# Patient Record
Sex: Female | Born: 1965 | Race: White | Hispanic: No | Marital: Married | Smoking: Never smoker
Health system: Southern US, Community
[De-identification: ages and names within clinical notes are randomized; demographics above are authoritative.]

## PROBLEM LIST (undated history)

## (undated) DIAGNOSIS — R350 Frequency of micturition: Secondary | ICD-10-CM

## (undated) DIAGNOSIS — G5603 Carpal tunnel syndrome, bilateral upper limbs: Secondary | ICD-10-CM

## (undated) DIAGNOSIS — K760 Fatty (change of) liver, not elsewhere classified: Secondary | ICD-10-CM

## (undated) DIAGNOSIS — E7800 Pure hypercholesterolemia, unspecified: Secondary | ICD-10-CM

## (undated) DIAGNOSIS — M0609 Rheumatoid arthritis without rheumatoid factor, multiple sites: Secondary | ICD-10-CM

## (undated) DIAGNOSIS — F419 Anxiety disorder, unspecified: Secondary | ICD-10-CM

## (undated) DIAGNOSIS — R7303 Prediabetes: Secondary | ICD-10-CM

## (undated) DIAGNOSIS — Z79899 Other long term (current) drug therapy: Secondary | ICD-10-CM

## (undated) DIAGNOSIS — F32A Depression, unspecified: Secondary | ICD-10-CM

## (undated) DIAGNOSIS — R202 Paresthesia of skin: Secondary | ICD-10-CM

## (undated) DIAGNOSIS — R200 Anesthesia of skin: Secondary | ICD-10-CM

## (undated) DIAGNOSIS — M2550 Pain in unspecified joint: Secondary | ICD-10-CM

## (undated) HISTORY — DX: Anesthesia of skin: R20.0

## (undated) HISTORY — DX: Pain in unspecified joint: M25.50

## (undated) HISTORY — PX: BREAST BIOPSY: SHX20

## (undated) HISTORY — PX: WISDOM TOOTH EXTRACTION: SHX21

## (undated) HISTORY — PX: OTHER SURGICAL HISTORY: SHX169

## (undated) HISTORY — DX: Other long term (current) drug therapy: Z79.899

## (undated) HISTORY — DX: Fatty (change of) liver, not elsewhere classified: K76.0

## (undated) HISTORY — DX: Paresthesia of skin: R20.2

## (undated) HISTORY — DX: Rheumatoid arthritis without rheumatoid factor, multiple sites: M06.09

## (undated) HISTORY — DX: Frequency of micturition: R35.0

## (undated) HISTORY — DX: Carpal tunnel syndrome, bilateral upper limbs: G56.03

---

## 2015-08-11 ENCOUNTER — Other Ambulatory Visit: Payer: Self-pay | Admitting: Family Medicine

## 2015-08-11 DIAGNOSIS — Z1239 Encounter for other screening for malignant neoplasm of breast: Secondary | ICD-10-CM

## 2015-08-11 DIAGNOSIS — Z1231 Encounter for screening mammogram for malignant neoplasm of breast: Secondary | ICD-10-CM

## 2015-08-25 ENCOUNTER — Ambulatory Visit
Admission: RE | Admit: 2015-08-25 | Discharge: 2015-08-25 | Disposition: A | Discharge status: Home / Self Care | Payer: BLUE CROSS/BLUE SHIELD | Source: Ambulatory Visit | Attending: Family Medicine | Admitting: Family Medicine

## 2015-08-25 DIAGNOSIS — Z1231 Encounter for screening mammogram for malignant neoplasm of breast: Secondary | ICD-10-CM

## 2015-09-06 ENCOUNTER — Other Ambulatory Visit: Payer: Self-pay | Admitting: Family Medicine

## 2015-09-06 DIAGNOSIS — R928 Other abnormal and inconclusive findings on diagnostic imaging of breast: Secondary | ICD-10-CM

## 2015-09-13 ENCOUNTER — Other Ambulatory Visit: Payer: Self-pay | Admitting: Family Medicine

## 2015-09-13 ENCOUNTER — Ambulatory Visit
Admission: RE | Admit: 2015-09-13 | Discharge: 2015-09-13 | Disposition: A | Discharge status: Home / Self Care | Payer: BLUE CROSS/BLUE SHIELD | Source: Ambulatory Visit | Attending: Family Medicine | Admitting: Family Medicine

## 2015-09-13 DIAGNOSIS — R928 Other abnormal and inconclusive findings on diagnostic imaging of breast: Secondary | ICD-10-CM

## 2015-09-15 ENCOUNTER — Other Ambulatory Visit: Payer: Self-pay | Admitting: Family Medicine

## 2015-09-15 DIAGNOSIS — R928 Other abnormal and inconclusive findings on diagnostic imaging of breast: Secondary | ICD-10-CM

## 2015-09-16 ENCOUNTER — Other Ambulatory Visit: Payer: Self-pay | Admitting: Family Medicine

## 2015-09-16 ENCOUNTER — Ambulatory Visit
Admission: RE | Admit: 2015-09-16 | Discharge: 2015-09-16 | Disposition: A | Discharge status: Home / Self Care | Payer: BLUE CROSS/BLUE SHIELD | Source: Ambulatory Visit | Attending: Family Medicine | Admitting: Family Medicine

## 2015-09-16 DIAGNOSIS — R928 Other abnormal and inconclusive findings on diagnostic imaging of breast: Secondary | ICD-10-CM

## 2015-11-17 ENCOUNTER — Encounter: Payer: Self-pay | Admitting: *Deleted

## 2015-11-18 ENCOUNTER — Ambulatory Visit (INDEPENDENT_AMBULATORY_CARE_PROVIDER_SITE_OTHER): Payer: BLUE CROSS/BLUE SHIELD | Admitting: Neurology

## 2015-11-18 ENCOUNTER — Encounter: Payer: Self-pay | Admitting: Neurology

## 2015-11-18 ENCOUNTER — Telehealth: Payer: Self-pay | Admitting: Neurology

## 2015-11-18 VITALS — BP 130/80 | HR 78 | Ht 65.0 in | Wt 173.6 lb

## 2015-11-18 DIAGNOSIS — R202 Paresthesia of skin: Secondary | ICD-10-CM

## 2015-11-18 DIAGNOSIS — G5603 Carpal tunnel syndrome, bilateral upper limbs: Secondary | ICD-10-CM | POA: Diagnosis not present

## 2015-11-18 DIAGNOSIS — M2550 Pain in unspecified joint: Secondary | ICD-10-CM

## 2015-11-18 NOTE — Telephone Encounter (Signed)
Noted  

## 2015-11-18 NOTE — Progress Notes (Signed)
[facility] Clinic Note - Initial Visit   Date: 11/18/2015  Nicole Petersen MRN: [id number] DOB: 19-Apr-1966   Dear Dr. Leafy Kindle, PA:  Thank you for your kind referral of Nicole Petersen for consultation of paresthesias. Although her history is well known to you, please allow [place] to reiterate it for the purpose of our medical record. The patient was accompanied to the clinic by self.    History of Present Illness: Nicole Petersen is a 50 y.o. right-handed Caucasian female with seronegative rheumatoid arthritis (on Humira) and history of fatty liver presents for evaluation of generalized paresthesias.    Starting the fall of 2016, she began waking up with numbness involving both hands, worse on the left.  Symptoms last about 10-minutes and then spontaneously improve.  She reports dropping objects several times per day.  Over the past few weeks, she has a linear area over the calf and posterior thigh described as numbness which is very sporadic, lasting a few seconds but recurring multiple times per day. She denies any weakness of the legs.  She was having tingling sensation over the entire lower back and was diagnosed with UTI. She was told there were crystals in her urine and reports that since urinating the crystals, her tingling resolved.  She did not wish to take antibiotics.    She also complains of generalized joint pain, especially achy pain in the hands and elbows. Pain was worse over the lateral elbow, but now involves the medial elbow.  If she sits or stands very long, her feet get very stiff. She complains of swollen hands. She was diagnosed with probable rheumatoid arthritis and is currently on Humira.    She was evaluated by rheumatology for widespread pain and generalized paresthesias and informed that symptoms are not consistent with inflammatory arthritis, but moreso fibromyalgia and referred for my opinion.   She denies any muscle pain, tenderness,  or generalized fatigue.    Out-side paper records, electronic medical record, and images have been reviewed where available and summarized as:  Labs 10/13/2015:  ESR 2, CRP 2.6, Na 144, K 5.1, Cr 0.62, ALT 47*, AST 24, BUN 19, vitamin B12 556, TSH 0.59  MRI right wrist with contrast 11/27/2014:  Findings of early inflammatory arthropathy predominately involving the wrist and MCP joints.  There is active tenosynovitis, synovitis, and early findings of subchondral edema of the 5th metacarpal head.  This may represent early manifestations of rheumatoid arthritis.   Past Medical History  Diagnosis Date  . Carpal tunnel syndrome on both sides   . Paresthesia of skin   . Encounter for long-term (current) use of high-risk medication   . Anesthesia of skin   . Frequency of micturition   . Fatty liver   . Rheumatoid arthritis of multiple sites without rheumatoid factor ([place])   . Polyarthralgia     Past Surgical History  Procedure Laterality Date  . None       Medications:  Outpatient Encounter Prescriptions as of 11/18/2015  Medication Sig  . Adalimumab (HUMIRA PEN) 40 MG/0.8ML PNKT Inject into the skin.  . Omega 3 1000 MG CAPS Take by mouth.  Marland Kitchen omeprazole (PRILOSEC) 20 MG capsule Take 20 mg by mouth daily.   No facility-administered encounter medications on file as of 11/18/2015.     Allergies: No Known Allergies  Family History: Family History  Problem Relation Age of Onset  . Heart disease Father     Deceased  . Diabetes Mother   .  Heart disease Mother     Deceased  . Hypertension Mother   . Lymphoma Brother     Deceased  . Healthy Sister   . Healthy Brother   . Healthy Son     Social History: Social History  Substance Use Topics  . Smoking status: Never Smoker   . Smokeless tobacco: Never Used  . Alcohol Use: No   Social History   Social History Narrative   Lives with husband in a one story home but does have to go up a flight of stairs to get into house.  Has one  son in the [place].     Works as a Designer, industrial/product.  Education: high school.    Review of Systems:  CONSTITUTIONAL: No fevers, chills, night sweats, or weight loss.   EYES: No visual changes or eye pain ENT: No hearing changes.  No history of nose bleeds.   RESPIRATORY: No cough, wheezing and shortness of breath.   CARDIOVASCULAR: Negative for chest pain, and palpitations.   GI: Negative for abdominal discomfort, blood in stools or black stools.  No recent change in bowel habits.   GU:  No history of incontinence.   MUSCLOSKELETAL: +history of joint pain or swelling.  No myalgias.   SKIN: Negative for lesions, rash, and itching.   HEMATOLOGY/ONCOLOGY: Negative for prolonged bleeding, bruising easily, and swollen nodes.  No history of cancer.   ENDOCRINE: Negative for cold or heat intolerance, polydipsia or goiter.   PSYCH:  No depression or anxiety symptoms.   NEURO: As Above.   Vital Signs:  BP 130/80 mmHg  Pulse 78  Ht 5' 5"  (1.651 m)  Wt 173 lb 9 oz (78.727 kg)  BMI 28.88 kg/m2  SpO2 98%   General Medical Exam:   General:  Well appearing, comfortable.   Eyes/ENT: see cranial nerve examination.   Neck: No masses appreciated.  Full range of motion without tenderness.  No carotid bruits. Respiratory:  Clear to auscultation, good air entry bilaterally.   Cardiac:  Regular rate and rhythm, no murmur.   Extremities:  No deformities, edema, or skin discoloration.  Skin:  No rashes or lesions.  Neurological Exam: MENTAL STATUS including orientation to time, place, person, recent and remote memory, attention span and concentration, language, and fund of knowledge is normal.  Speech is not dysarthric.  CRANIAL NERVES: II:  No visual field defects.  Unremarkable fundi.   III-IV-VI: Pupils equal round and reactive to light.  Normal conjugate, extra-ocular eye movements in all directions of gaze.  No nystagmus.  No ptosis.   V:  Normal facial sensation.     VII:  Normal facial symmetry  and movements.  No pathologic facial reflexes.  VIII:  Normal hearing and vestibular function.   IX-X:  Normal palatal movement.   XI:  Normal shoulder shrug and head rotation.   XII:  Normal tongue strength and range of motion, no deviation or fasciculation.  MOTOR:  No atrophy, fasciculations or abnormal movements.  No pronator drift.  Tone is normal.    Right Upper Extremity:    Left Upper Extremity:    Deltoid  5/5   Deltoid  5/5   Biceps  5/5   Biceps  5/5   Triceps  5/5   Triceps  5/5   Wrist extensors  5/5   Wrist extensors  5/5   Wrist flexors  5/5   Wrist flexors  5/5   Finger extensors  5/5   Finger extensors  5/5  Finger flexors  5/5   Finger flexors  5/5   Dorsal interossei  5/5   Dorsal interossei  5/5   Abductor pollicis  5/5   Abductor pollicis  5/5   Tone (Ashworth scale)  0  Tone (Ashworth scale)  0   Right Lower Extremity:    Left Lower Extremity:    Hip flexors  5/5   Hip flexors  5/5   Hip extensors  5/5   Hip extensors  5/5   Knee flexors  5/5   Knee flexors  5/5   Knee extensors  5/5   Knee extensors  5/5   Dorsiflexors  5/5   Dorsiflexors  5/5   Plantarflexors  5/5   Plantarflexors  5/5   Toe extensors  5/5   Toe extensors  5/5   Toe flexors  5/5   Toe flexors  5/5   Tone (Ashworth scale)  0  Tone (Ashworth scale)  0   MSRs:  Right                                                                 Left brachioradialis 2+  brachioradialis 2+  biceps 2+  biceps 2+  triceps 2+  triceps 2+  patellar 2+  patellar 2+  ankle jerk 2+  ankle jerk 2+  Hoffman no  Hoffman no  plantar response down  plantar response down   SENSORY:  Normal and symmetric perception of light touch, pinprick, vibration, and proprioception. There is no sensory level.   Romberg's sign absent.   COORDINATION/GAIT: Normal finger-to- nose-finger and heel-to-shin.  Intact rapid alternating movements bilaterally.  Able to rise from a chair without using arms.  Gait narrow based and stable.  Tandem and stressed gait intact.   Other:  She is not tender over any of the FP tenderpoints, except point tenderness over the medial elbow.  IMPRESSION: Ms. Kracke is a 50 year-old female referred for evaluation of generalized paresthesias.  Her hand paresthesias are most consistent with carpal tunnel syndrome.  It was explained that even if she did have CTS, her hand swelling and joint pain cannot be explained by CTS and she may have an underlying arthritis, but I will defer to rheumatology about the nature of jpolyarthriagias and swelling.  She does not have symptoms consistent with fibromylagia and lacks the typical tenderpoints for this diagnosis.  Her leg paresthesias are sporadic and do not conform to a dermatomal or cutaneous nerve distribution, so these are most likely nonspecific paresthesias.  To be sure a lumbosacral radiculopathy is not missed, NCS/EMG will be performed.  PLAN/RECOMMENDATIONS:  NCS/EMG of the left arm and leg with additional median studies on the right Results with be communicated via telephone  Return to clinic as needed  The duration of this appointment visit was 45 minutes of face-to-face time with the patient.  Greater than 50% of this time was spent in counseling, explanation of diagnosis, planning of further management, and coordination of care.   Thank you for allowing me to participate in patient's care.  If I can answer any additional questions, I would be pleased to do so.    Sincerely,    Maykel Reitter K. Posey Pronto, DO

## 2015-11-18 NOTE — Telephone Encounter (Signed)
Nicole BlightDonna Petersen Nov 06, 2065 called to let you know she left her disk here. She had an appointment with Dr. Allena KatzPatel this morning. She would like to pick it back up at her next appointment. Thank you

## 2015-11-18 NOTE — Patient Instructions (Signed)
NCS/EMG of the left and leg We will call you with the results of the NCS/EMG

## 2015-11-23 ENCOUNTER — Ambulatory Visit (INDEPENDENT_AMBULATORY_CARE_PROVIDER_SITE_OTHER): Payer: BLUE CROSS/BLUE SHIELD | Admitting: Neurology

## 2015-11-23 DIAGNOSIS — R202 Paresthesia of skin: Secondary | ICD-10-CM

## 2015-11-23 DIAGNOSIS — M2550 Pain in unspecified joint: Secondary | ICD-10-CM

## 2015-11-23 NOTE — Procedures (Signed)
[facility]  [address], [address]  [place], [place] [address] Tel: [phone] Fax:  [phone] Test Date:  11/23/2015  Patient: Nicole Petersen DOB: 14-Feb-1966 Physician: Nita Sickleonika Patel, DO  Sex: Female Height: 5\' 5"  Ref Phys: Nita Sickleonika Patel, DO  ID#: [id number] Temp: 34.2C Technician: Judie PetitM. Dean   Patient Complaints: This is a 50 year old female referred for evaluation of bilateral hand and left lower extremity paresthesias.  NCV & EMG Findings: Extensive electrodiagnostic testing of the left upper and lower extremities and additional studies of the right upper extremity shows: 1. All sensory responses are within normal limits including bilateral median, left ulnar, left sural, and left superficial peroneal sensory responses are within normal limits. Bilateral mixed palmar studies are within normal limits. 2. All motor responses including bilateral median, left ulnar, left peroneal, and left tibial motor nerves are within normal limits. 3. There is no evidence of active or chronic motor axon loss changes affecting any of the tested muscles. Motor unit configuration and recruitment pattern is within normal limits.  Impression: This is a normal study. In particular, there is no evidence of carpal tunnel syndrome bilaterally, diffuse myopathy, sensorimotor polyneuropathy, or a cervical/lumbosacral radiculopathy.   ___________________________ Nita Sickleonika Patel, DO    Nerve Conduction Studies Anti Sensory Summary Table   Stim Site NR Peak (ms) Norm Peak (ms) P-T Amp (V) Norm P-T Amp  Left Median Anti Sensory (2nd Digit)  Wrist    3.1 <3.6 26.4 >15  Right Median Anti Sensory (2nd Digit)  Wrist    3.2 <3.6 22.5 >15  Left Sup Peroneal Anti Sensory (Ant Lat Mall)  12 cm    3.5 <4.6 9.1 >4  Left Sural Anti Sensory (Lat Mall)  Calf    3.3 <4.6 15.3 >4  Left Ulnar Anti Sensory (5th Digit)  Wrist    2.9 <3.1 25.9 >10   Motor Summary Table   Stim Site NR Onset (ms) Norm Onset  (ms) O-P Amp (mV) Norm O-P Amp Site1 Site2 Delta-0 (ms) Dist (cm) Vel (m/s) Norm Vel (m/s)  Left Median Motor (Abd Poll Brev)  Wrist    3.3 <4.0 9.7 >6 Elbow Wrist 4.0 23.0 58 >50  Elbow    7.3  9.4         Right Median Motor (Abd Poll Brev)  Wrist    3.7 <4.0 7.7 >6 Elbow Wrist 3.8 23.0 61 >50  Elbow    7.5  7.2         Left Peroneal Motor (Ext Dig Brev)  Ankle    3.5 <6.0 2.8 >2.5 B Fib Ankle 6.7 33.0 49 >40  B Fib    10.2  2.6  Poplt B Fib 2.1 10.0 48 >40  Poplt    12.3  2.6         Left Tibial Motor (Abd Hall Brev)  Ankle    4.1 <6.0 4.1 >4 Knee Ankle 7.5 38.0 51 >40  Knee    11.6  3.5         Left Ulnar Motor (Abd Dig Minimi)  Wrist    2.7 <3.1 8.7 >7 B Elbow Wrist 2.8 17.0 61 >50  B Elbow    5.5  8.6  A Elbow B Elbow 1.6 10.0 63 >50  A Elbow    7.1  8.6          Comparison Summary Table   Stim Site NR Peak (ms) Norm Peak (ms) P-T Amp (V) Site1 Site2 Delta-P (ms) Norm Delta (  ms)  Left Median/Ulnar Palm Comparison (Wrist - 8cm)  Median Palm    1.5 <2.2 87.9 Median Palm Ulnar Palm 0.2   Ulnar Palm    1.3 <2.2 18.8      Right Median/Ulnar Palm Comparison (Wrist - 8cm)  Median Palm    1.5 <2.2 73.7 Median Palm Ulnar Palm 0.1   Ulnar Palm    1.4 <2.2 24.2       EMG   Side Muscle Ins Act Fibs Psw Fasc Number Recrt Dur Dur. Amp Amp. Poly Poly. Comment  Left AntTibialis Nml Nml Nml Nml Nml Nml Nml Nml Nml Nml Nml Nml N/A  Left 1stDorInt Nml Nml Nml Nml Nml Nml Nml Nml Nml Nml Nml Nml N/A  Left Ext Indicis Nml Nml Nml Nml Nml Nml Nml Nml Nml Nml Nml Nml N/A  Left PronatorTeres Nml Nml Nml Nml Nml Nml Nml Nml Nml Nml Nml Nml N/A  Left Biceps Nml Nml Nml Nml Nml Nml Nml Nml Nml Nml Nml Nml N/A  Left Triceps Nml Nml Nml Nml Nml Nml Nml Nml Nml Nml Nml Nml N/A  Left Deltoid Nml Nml Nml Nml Nml Nml Nml Nml Nml Nml Nml Nml N/A  Left Gastroc Nml Nml Nml Nml Nml Nml Nml Nml Nml Nml Nml Nml N/A  Left RectFemoris Nml Nml Nml Nml Nml Nml Nml Nml Nml Nml Nml Nml N/A  Left GluteusMed Nml Nml  Nml Nml Nml Nml Nml Nml Nml Nml Nml Nml N/A  Left BicepsFemS Nml Nml Nml Nml Nml Nml Nml Nml Nml Nml Nml Nml N/A      Waveforms:

## 2015-11-30 ENCOUNTER — Encounter: Payer: BLUE CROSS/BLUE SHIELD | Admitting: Neurology

## 2015-12-14 ENCOUNTER — Other Ambulatory Visit: Payer: Self-pay | Admitting: Physician Assistant

## 2015-12-14 DIAGNOSIS — M79642 Pain in left hand: Secondary | ICD-10-CM

## 2015-12-19 ENCOUNTER — Ambulatory Visit
Admission: RE | Admit: 2015-12-19 | Discharge: 2015-12-19 | Disposition: A | Discharge status: Home / Self Care | Payer: BLUE CROSS/BLUE SHIELD | Source: Ambulatory Visit | Attending: Physician Assistant | Admitting: Physician Assistant

## 2015-12-19 DIAGNOSIS — M79642 Pain in left hand: Secondary | ICD-10-CM

## 2015-12-19 MED ORDER — GADOBENATE DIMEGLUMINE 529 MG/ML IV SOLN
15.0000 mL | Freq: Once | INTRAVENOUS | Status: AC | PRN
  Administered 2015-12-19: 15 mL via INTRAVENOUS

## 2016-02-10 NOTE — Telephone Encounter (Signed)
Closed

## 2017-11-02 ENCOUNTER — Other Ambulatory Visit: Payer: Self-pay | Admitting: Physician Assistant

## 2017-11-02 DIAGNOSIS — N6489 Other specified disorders of breast: Secondary | ICD-10-CM

## 2017-11-08 ENCOUNTER — Ambulatory Visit
Admission: RE | Admit: 2017-11-08 | Discharge: 2017-11-08 | Disposition: A | Discharge status: Home / Self Care | Payer: BLUE CROSS/BLUE SHIELD | Source: Ambulatory Visit | Attending: Physician Assistant | Admitting: Physician Assistant

## 2017-11-08 DIAGNOSIS — N6489 Other specified disorders of breast: Secondary | ICD-10-CM

## 2018-06-19 ENCOUNTER — Other Ambulatory Visit: Payer: Self-pay | Admitting: Physician Assistant

## 2018-06-19 DIAGNOSIS — M25552 Pain in left hip: Secondary | ICD-10-CM

## 2018-06-19 DIAGNOSIS — M25551 Pain in right hip: Secondary | ICD-10-CM

## 2018-06-30 ENCOUNTER — Ambulatory Visit
Admission: RE | Admit: 2018-06-30 | Discharge: 2018-06-30 | Disposition: A | Discharge status: Home / Self Care | Payer: Managed Care, Other (non HMO) | Source: Ambulatory Visit | Attending: Physician Assistant | Admitting: Physician Assistant

## 2018-06-30 ENCOUNTER — Ambulatory Visit
Admission: RE | Admit: 2018-06-30 | Discharge: 2018-06-30 | Disposition: A | Discharge status: Home / Self Care | Payer: No Typology Code available for payment source | Source: Ambulatory Visit | Attending: Physician Assistant | Admitting: Physician Assistant

## 2018-06-30 DIAGNOSIS — M25551 Pain in right hip: Secondary | ICD-10-CM

## 2018-06-30 DIAGNOSIS — M25552 Pain in left hip: Secondary | ICD-10-CM

## 2020-01-04 IMAGING — MR MR HIP*R* W/O CM
6 series · 40 of 40 positions shown · non-contrast
Comparison: None.

CLINICAL DATA: Bilateral hip and groin pain for 6 months.

EXAM:
MR OF THE RIGHT HIP WITHOUT CONTRAST
TECHNIQUE: Multiplanar, multisequence MR imaging was performed. No intravenous
contrast was administered.

[Series 3: T1 · coronal · 4.0mm · 1.41mm/px · 8 of 34 slices shown]
[im 1/34]
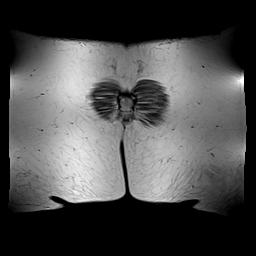
[im 5/34]
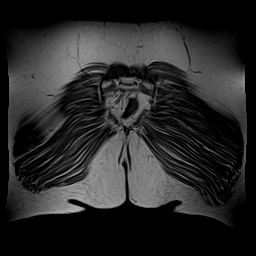
[im 10/34]
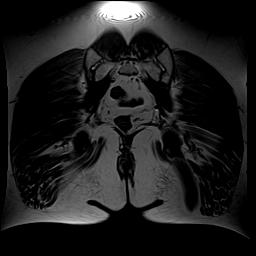
[im 15/34]
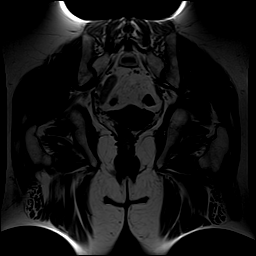
[im 19/34]
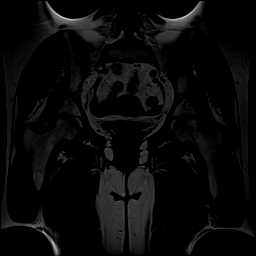
[im 24/34]
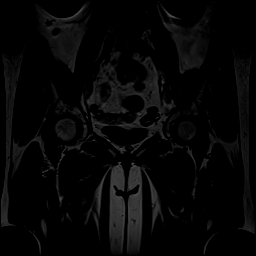
[im 29/34]
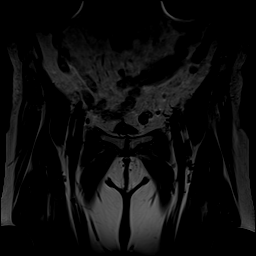
[im 34/34]
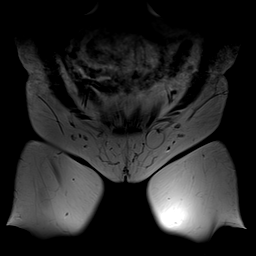

[Series 4: STIR · coronal · 4.0mm · 1.41mm/px · 8 of 34 slices shown]
[im 1/34]
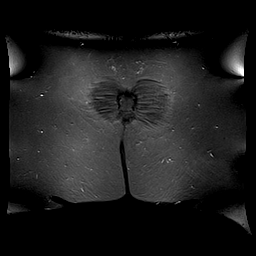
[im 5/34]
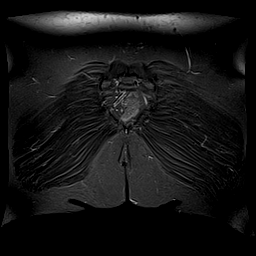
[im 10/34]
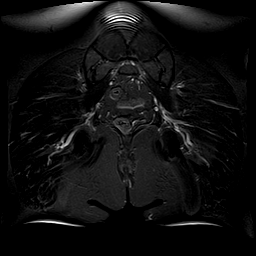
[im 15/34]
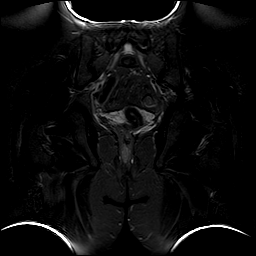
[im 19/34]
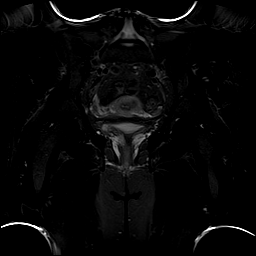
[im 24/34]
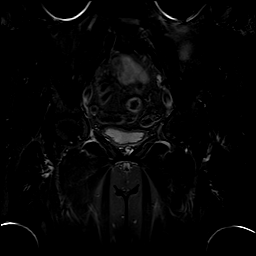
[im 29/34]
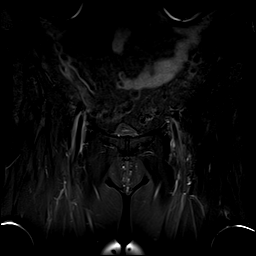
[im 34/34]
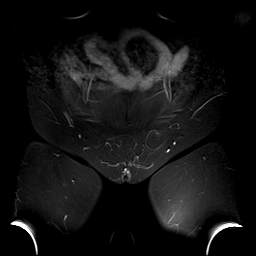

[Series 7: T2 fat-sat · axial · 4.0mm · 0.70mm/px · z∈[-67,+68]mm · 6 of 28 slices shown]
[im 1/28]
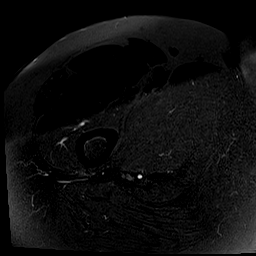
[im 6/28]
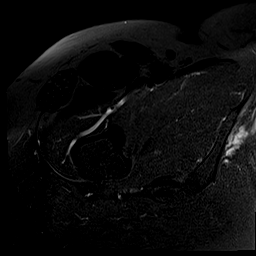
[im 11/28]
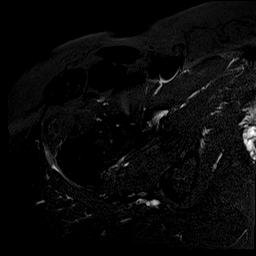
[im 17/28]
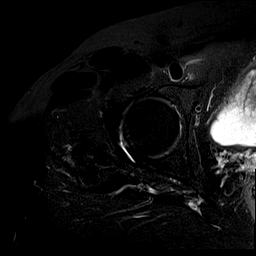
[im 22/28]
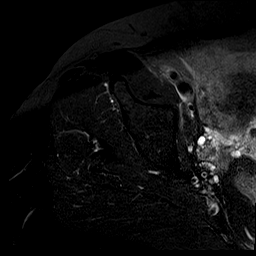
[im 28/28]
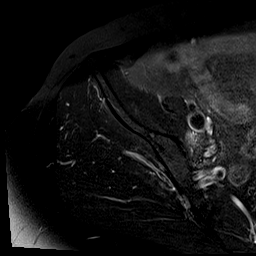

[Series 8: PD fat-sat · sagittal · 4.0mm · 0.70mm/px · 6 of 26 slices shown (1 of 2)]
[im 1/26]
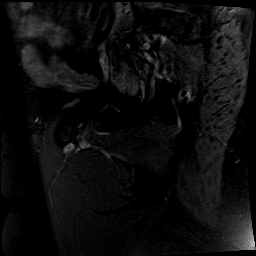
[im 6/26]
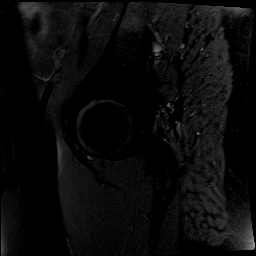
[im 11/26]
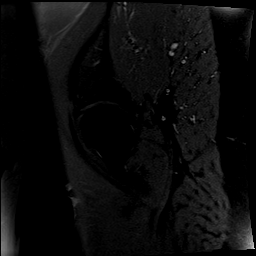
[im 16/26]
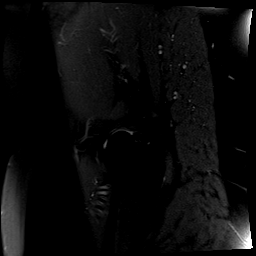
[im 21/26]
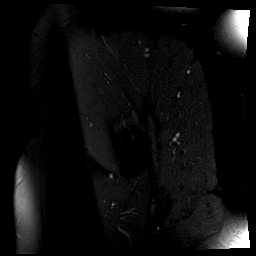
[im 26/26]
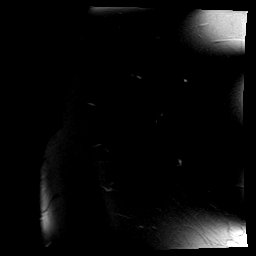

[Series 9: PD fat-sat · coronal · 4.0mm · 0.70mm/px · 6 of 28 slices shown (2 of 2)]
[im 1/28]
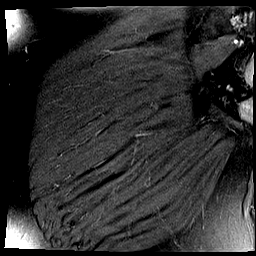
[im 6/28]
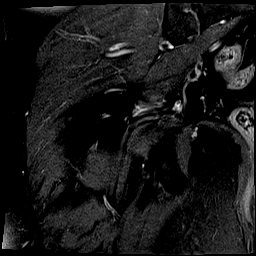
[im 11/28]
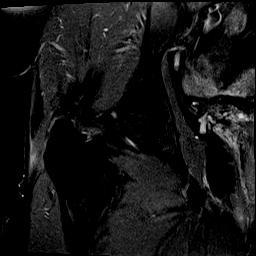
[im 17/28]
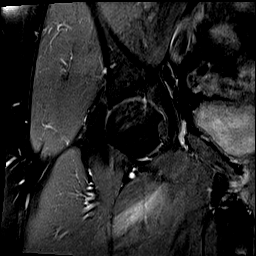
[im 22/28]
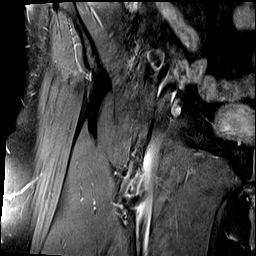
[im 28/28]
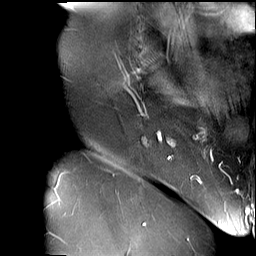

[Series 10: PD · axial · 4.0mm · 0.70mm/px · z∈[-108,-7]mm · 6 of 26 slices shown]
[im 1/26]
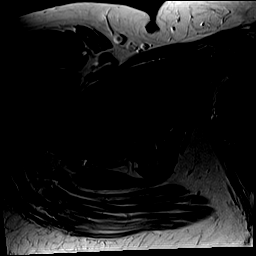
[im 6/26]
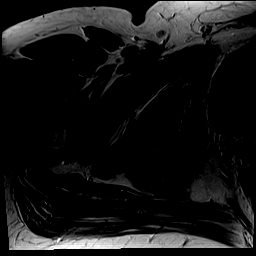
[im 11/26]
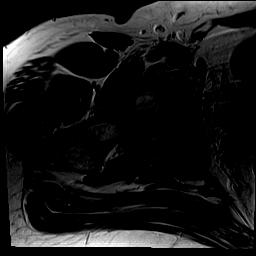
[im 16/26]
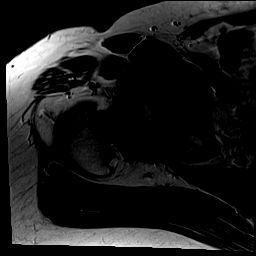
[im 21/26]
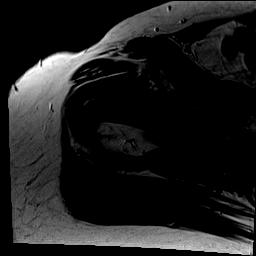
[im 26/26]
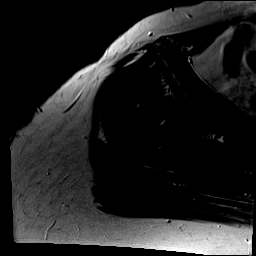

[40 of 40 positions shown; findings below may reference images not displayed]

FINDINGS: Bones: Mild marginal spurring of the right femoral head. No marrow
edema or additional significant osseous abnormality.

Articular cartilage and labrum

Articular cartilage: Mild degenerative chondral thinning. No focal
chondral defect.

Labrum:  Grossly unremarkable

Joint or bursal effusion

Joint effusion:  Absent

Bursae: Trace trochanteric bursitis, image [DATE].

Muscles and tendons

Muscles and tendons:  Unremarkable

Other findings

Miscellaneous:   Small uterine fibroids.
IMPRESSION: 1. Mild degenerative arthropathy of the right hip.
2. Trace right trochanteric bursitis.
3. Small uterine fibroids.

## 2022-12-29 ENCOUNTER — Other Ambulatory Visit: Payer: Self-pay | Admitting: Family Medicine

## 2022-12-29 DIAGNOSIS — Z1231 Encounter for screening mammogram for malignant neoplasm of breast: Secondary | ICD-10-CM

## 2023-01-01 ENCOUNTER — Other Ambulatory Visit: Payer: Self-pay | Admitting: Family Medicine

## 2023-01-01 DIAGNOSIS — K760 Fatty (change of) liver, not elsewhere classified: Secondary | ICD-10-CM

## 2023-01-18 ENCOUNTER — Ambulatory Visit: Payer: Managed Care, Other (non HMO)

## 2023-01-18 DIAGNOSIS — Z1231 Encounter for screening mammogram for malignant neoplasm of breast: Secondary | ICD-10-CM

## 2023-01-18 DIAGNOSIS — K760 Fatty (change of) liver, not elsewhere classified: Secondary | ICD-10-CM | POA: Diagnosis not present

## 2023-05-25 ENCOUNTER — Other Ambulatory Visit: Payer: Self-pay

## 2023-05-25 ENCOUNTER — Encounter (HOSPITAL_BASED_OUTPATIENT_CLINIC_OR_DEPARTMENT_OTHER): Payer: Self-pay | Admitting: Orthopedic Surgery

## 2023-06-01 ENCOUNTER — Ambulatory Visit (HOSPITAL_BASED_OUTPATIENT_CLINIC_OR_DEPARTMENT_OTHER): Payer: Managed Care, Other (non HMO) | Admitting: Certified Registered Nurse Anesthetist

## 2023-06-01 ENCOUNTER — Encounter (HOSPITAL_BASED_OUTPATIENT_CLINIC_OR_DEPARTMENT_OTHER): Payer: Self-pay | Admitting: Orthopedic Surgery

## 2023-06-01 ENCOUNTER — Ambulatory Visit (HOSPITAL_BASED_OUTPATIENT_CLINIC_OR_DEPARTMENT_OTHER)
Admission: RE | Admit: 2023-06-01 | Discharge: 2023-06-01 | Disposition: A | Discharge status: Home / Self Care | Payer: Managed Care, Other (non HMO) | Attending: Orthopedic Surgery | Admitting: Orthopedic Surgery

## 2023-06-01 ENCOUNTER — Other Ambulatory Visit: Payer: Self-pay

## 2023-06-01 ENCOUNTER — Encounter (HOSPITAL_BASED_OUTPATIENT_CLINIC_OR_DEPARTMENT_OTHER)
Admission: RE | Disposition: A | Discharge status: Home / Self Care | Payer: Self-pay | Source: Home / Self Care | Attending: Orthopedic Surgery

## 2023-06-01 DIAGNOSIS — S83241A Other tear of medial meniscus, current injury, right knee, initial encounter: Secondary | ICD-10-CM | POA: Diagnosis present

## 2023-06-01 DIAGNOSIS — M94261 Chondromalacia, right knee: Secondary | ICD-10-CM | POA: Diagnosis not present

## 2023-06-01 DIAGNOSIS — M069 Rheumatoid arthritis, unspecified: Secondary | ICD-10-CM | POA: Diagnosis not present

## 2023-06-01 DIAGNOSIS — X58XXXA Exposure to other specified factors, initial encounter: Secondary | ICD-10-CM | POA: Diagnosis not present

## 2023-06-01 HISTORY — DX: Pure hypercholesterolemia, unspecified: E78.00

## 2023-06-01 HISTORY — DX: Anxiety disorder, unspecified: F41.9

## 2023-06-01 HISTORY — PX: KNEE ARTHROSCOPY WITH MEDIAL MENISECTOMY: SHX5651

## 2023-06-01 SURGERY — ARTHROSCOPY, KNEE, WITH MEDIAL MENISCECTOMY
Anesthesia: General | Site: Knee | Laterality: Right

## 2023-06-01 MED ORDER — KETOROLAC TROMETHAMINE 30 MG/ML IJ SOLN
INTRAMUSCULAR | Status: DC | PRN
  Administered 2023-06-01: 30 mg via INTRAVENOUS

## 2023-06-01 MED ORDER — DEXAMETHASONE SODIUM PHOSPHATE 4 MG/ML IJ SOLN
INTRAMUSCULAR | Status: DC | PRN
  Administered 2023-06-01: 5 mg via INTRAVENOUS

## 2023-06-01 MED ORDER — ONDANSETRON HCL 4 MG PO TABS: 4.0000 mg | ORAL_TABLET | Freq: Three times a day (TID) | ORAL | 0 refills | Status: DC | PRN

## 2023-06-01 MED ORDER — OXYCODONE HCL 5 MG PO TABS: 5.0000 mg | ORAL_TABLET | Freq: Once | ORAL | Status: DC | PRN

## 2023-06-01 MED ORDER — BUPIVACAINE-EPINEPHRINE (PF) 0.5% -1:200000 IJ SOLN
INTRAMUSCULAR | Status: DC | PRN
Start: 2023-06-01 — End: 2023-06-01
  Administered 2023-06-01: 20 mL

## 2023-06-01 MED ORDER — CEFAZOLIN SODIUM-DEXTROSE 1-4 GM/50ML-% IV SOLN
INTRAVENOUS | Status: AC
  Filled 2023-06-01: qty 50

## 2023-06-01 MED ORDER — PROPOFOL 10 MG/ML IV BOLUS
INTRAVENOUS | Status: AC
  Filled 2023-06-01: qty 20

## 2023-06-01 MED ORDER — BUPIVACAINE-EPINEPHRINE (PF) 0.5% -1:200000 IJ SOLN
INTRAMUSCULAR | Status: AC
  Filled 2023-06-01: qty 30

## 2023-06-01 MED ORDER — SODIUM CHLORIDE 0.9 % IR SOLN
Status: DC | PRN
  Administered 2023-06-01: 1500 mL

## 2023-06-01 MED ORDER — ACETAMINOPHEN 10 MG/ML IV SOLN
INTRAVENOUS | Status: DC | PRN
Start: 2023-06-01 — End: 2023-06-01
  Administered 2023-06-01: 1000 mg via INTRAVENOUS

## 2023-06-01 MED ORDER — HYDROCODONE-ACETAMINOPHEN 5-325 MG PO TABS: 1.0000 | ORAL_TABLET | Freq: Four times a day (QID) | ORAL | 0 refills | Status: DC | PRN

## 2023-06-01 MED ORDER — SCOPOLAMINE 1 MG/3DAYS TD PT72
MEDICATED_PATCH | TRANSDERMAL | Status: AC
  Filled 2023-06-01: qty 1

## 2023-06-01 MED ORDER — DROPERIDOL 2.5 MG/ML IJ SOLN: 0.6250 mg | Freq: Once | INTRAMUSCULAR | Status: DC | PRN

## 2023-06-01 MED ORDER — LIDOCAINE 2% (20 MG/ML) 5 ML SYRINGE
INTRAMUSCULAR | Status: AC
  Filled 2023-06-01: qty 5

## 2023-06-01 MED ORDER — MIDAZOLAM HCL 5 MG/5ML IJ SOLN
INTRAMUSCULAR | Status: DC | PRN
  Administered 2023-06-01: 2 mg via INTRAVENOUS

## 2023-06-01 MED ORDER — CEFAZOLIN SODIUM-DEXTROSE 2-4 GM/100ML-% IV SOLN
2.0000 g | INTRAVENOUS | Status: AC
  Administered 2023-06-01: 2 g via INTRAVENOUS

## 2023-06-01 MED ORDER — ONDANSETRON HCL 4 MG/2ML IJ SOLN
INTRAMUSCULAR | Status: DC | PRN
  Administered 2023-06-01: 4 mg via INTRAVENOUS

## 2023-06-01 MED ORDER — KETOROLAC TROMETHAMINE 30 MG/ML IJ SOLN
INTRAMUSCULAR | Status: AC
  Filled 2023-06-01: qty 1

## 2023-06-01 MED ORDER — ACETAMINOPHEN 500 MG PO TABS: 1000.0000 mg | ORAL_TABLET | Freq: Once | ORAL | Status: DC

## 2023-06-01 MED ORDER — FENTANYL CITRATE (PF) 100 MCG/2ML IJ SOLN
INTRAMUSCULAR | Status: AC
  Filled 2023-06-01: qty 2

## 2023-06-01 MED ORDER — FENTANYL CITRATE (PF) 100 MCG/2ML IJ SOLN: 25.0000 ug | INTRAMUSCULAR | Status: DC | PRN

## 2023-06-01 MED ORDER — SCOPOLAMINE 1 MG/3DAYS TD PT72
1.0000 | MEDICATED_PATCH | Freq: Once | TRANSDERMAL | Status: AC
  Administered 2023-06-01: 1 via TRANSDERMAL

## 2023-06-01 MED ORDER — PROPOFOL 10 MG/ML IV BOLUS
INTRAVENOUS | Status: DC | PRN
  Administered 2023-06-01: 150 mg via INTRAVENOUS

## 2023-06-01 MED ORDER — LIDOCAINE HCL (CARDIAC) PF 100 MG/5ML IV SOSY
PREFILLED_SYRINGE | INTRAVENOUS | Status: DC | PRN
  Administered 2023-06-01: 100 mg via INTRAVENOUS

## 2023-06-01 MED ORDER — LACTATED RINGERS IV SOLN: INTRAVENOUS | Status: DC

## 2023-06-01 MED ORDER — FENTANYL CITRATE (PF) 100 MCG/2ML IJ SOLN
INTRAMUSCULAR | Status: DC | PRN
  Administered 2023-06-01: 50 ug via INTRAVENOUS
  Administered 2023-06-01 (×2): 25 ug via INTRAVENOUS

## 2023-06-01 MED ORDER — ONDANSETRON HCL 4 MG/2ML IJ SOLN
INTRAMUSCULAR | Status: AC
  Filled 2023-06-01: qty 2

## 2023-06-01 MED ORDER — BUPIVACAINE HCL (PF) 0.25 % IJ SOLN
INTRAMUSCULAR | Status: AC
  Filled 2023-06-01: qty 30

## 2023-06-01 MED ORDER — MIDAZOLAM HCL 2 MG/2ML IJ SOLN
INTRAMUSCULAR | Status: AC
  Filled 2023-06-01: qty 2

## 2023-06-01 MED ORDER — DEXAMETHASONE SODIUM PHOSPHATE 10 MG/ML IJ SOLN
INTRAMUSCULAR | Status: AC
  Filled 2023-06-01: qty 1

## 2023-06-01 MED ORDER — OXYCODONE HCL 5 MG/5ML PO SOLN: 5.0000 mg | Freq: Once | ORAL | Status: DC | PRN

## 2023-06-01 MED ORDER — LACTATED RINGERS IV SOLN
INTRAVENOUS | Status: DC | PRN
Start: 2023-06-01 — End: 2023-06-01

## 2023-06-01 MED ORDER — ACETAMINOPHEN 10 MG/ML IV SOLN
INTRAVENOUS | Status: AC
  Filled 2023-06-01: qty 100

## 2023-06-01 SURGICAL SUPPLY — 29 items
BLADE SHAVER TORPEDO 4X13 (MISCELLANEOUS) ×1 IMPLANT
BNDG CMPR 6 X 5 YARDS HK CLSR (GAUZE/BANDAGES/DRESSINGS) ×1
BNDG ELASTIC 6INX 5YD STR LF (GAUZE/BANDAGES/DRESSINGS) ×1 IMPLANT
CLSR STERI-STRIP ANTIMIC 1/2X4 (GAUZE/BANDAGES/DRESSINGS) ×1 IMPLANT
DRAPE U-SHAPE 47X51 STRL (DRAPES) ×1 IMPLANT
DRAPE-T ARTHROSCOPY W/POUCH (DRAPES) ×1 IMPLANT
DURAPREP 26ML APPLICATOR (WOUND CARE) ×1 IMPLANT
GAUZE PAD ABD 8X10 STRL (GAUZE/BANDAGES/DRESSINGS) ×1 IMPLANT
GAUZE SPONGE 4X4 12PLY STRL (GAUZE/BANDAGES/DRESSINGS) ×1 IMPLANT
GAUZE XEROFORM 1X8 LF (GAUZE/BANDAGES/DRESSINGS) ×1 IMPLANT
GLOVE BIO SURGEON STRL SZ7 (GLOVE) IMPLANT
GLOVE BIO SURGEON STRL SZ7.5 (GLOVE) ×2 IMPLANT
GLOVE BIOGEL PI IND STRL 7.0 (GLOVE) IMPLANT
GLOVE BIOGEL PI IND STRL 7.5 (GLOVE) IMPLANT
GLOVE BIOGEL PI IND STRL 8 (GLOVE) ×2 IMPLANT
GLOVE SURG SS PI 7.0 STRL IVOR (GLOVE) IMPLANT
GOWN STRL REUS W/ TWL LRG LVL3 (GOWN DISPOSABLE) ×1 IMPLANT
GOWN STRL REUS W/TWL LRG LVL3 (GOWN DISPOSABLE) ×1
GOWN STRL REUS W/TWL XL LVL3 (GOWN DISPOSABLE) ×2 IMPLANT
MANIFOLD NEPTUNE II (INSTRUMENTS) ×1 IMPLANT
PACK ARTHROSCOPY DSU (CUSTOM PROCEDURE TRAY) ×1 IMPLANT
PACK BASIN DAY SURGERY FS (CUSTOM PROCEDURE TRAY) ×1 IMPLANT
PACK ICE MAXI GEL EZY WRAP (MISCELLANEOUS) IMPLANT
SUT ETHILON 4 0 PS 2 18 (SUTURE) ×1 IMPLANT
SUT MNCRL AB 3-0 PS2 18 (SUTURE) ×1 IMPLANT
TOWEL GREEN STERILE FF (TOWEL DISPOSABLE) ×1 IMPLANT
TUBE CONNECTING 20X1/4 (TUBING) IMPLANT
TUBING ARTHROSCOPY IRRIG 16FT (MISCELLANEOUS) ×1 IMPLANT
WAND ABLATOR APOLLO I90 (BUR) IMPLANT

## 2023-06-01 NOTE — Op Note (Signed)
Surgery Date: 06/01/2023  Surgeon(s): Yolonda Kida, MD  Assistant: Zadie Cleverly, PA-C  Assistant attestation: PA Haus here for the entire procedure.  ANESTHESIA:  general, local with half percent Marcaine and epinephrine  FLUIDS: Per anesthesia record.   ESTIMATED BLOOD LOSS: minimal  PREOPERATIVE DIAGNOSES:  1.  Right knee medial meniscus tear 2.  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  Right knee arthroscopy with limited synovectomy 2.  Right knee arthroscopy with arthroscopic partial medial meniscectomy 3.  Right knee arthroscopy with arthroscopic chondroplasty medial femoral condyle   DESCRIPTION OF PROCEDURE: Ms. Reifschneider is a 57 y.o.-year-old female with right knee medial meniscus tear. Plans are to proceed with partial medial meniscectomy and diagnostic arthroscopy with debridement as indicated. Full discussion held regarding risks benefits alternatives and complications related surgical intervention. Conservative care options reviewed. All questions answered.  The patient was identified in the preoperative holding area and the operative extremity was marked. The patient was brought to the operating room and transferred to operating table in a supine position. Satisfactory general anesthesia was induced by anesthesiology.    Standard anterolateral, anteromedial arthroscopy portals were obtained. The anteromedial portal was obtained with a spinal needle for localization under direct visualization with subsequent diagnostic findings.   Anteromedial and anterolateral chambers: mild synovitis. The synovitis was debrided with a 4.5 mm full radius shaver through both the anteromedial and lateral portals.   Suprapatellar pouch and gutters: no synovitis or debris. Patella chondral surface: Grade 0 Trochlear chondral surface: Grade 2 Patellofemoral tracking: Midline level Medial meniscus:  Horizontal cleavage tear of the posterior horn and mid body.  There was a short radial component right at the junction of the mid body and posterior horn that did propagate through to the red zone..  Medial femoral condyle flexion bearing surface: Grade 2 Medial femoral condyle extension bearing surface: Grade 2 Medial tibial plateau: Grade 0 Anterior cruciate ligament:stable Posterior cruciate ligament:stable Lateral meniscus: Intact without tearing.   Lateral femoral condyle flexion bearing surface: Grade 0 Lateral femoral condyle extension bearing surface: Grade 0 Lateral tibial plateau: Grade 0  Chondroplasty was performed with motorized shaver to smooth the chondral flaps on the flexion surface medial femoral condyle.  Next, we turned our attention to the medial meniscus tear.  We used combination of meniscal biter as well as combination motorized shaver to debride unstable tissue from the interface of the tear and to create a nice transition zone.  After completion of synovectomy, diagnostic exam, and debridements as described, all compartments were checked and no residual debris remained. Hemostasis was achieved with the cautery wand. The portals were approximated with buried monocryl. All excess fluid was expressed from the joint.  Xeroform sterile gauze dressings were applied followed by Ace bandage and ice pack.   DISPOSITION: The patient was awakened from general anesthetic, extubated, taken to the recovery room in medically stable condition, no apparent complications. The patient may be weightbearing as tolerated to the operative lower extremity.  Range of motion of right knee as tolerated.

## 2023-06-01 NOTE — Brief Op Note (Signed)
06/01/2023  1:05 PM  PATIENT:  Nicole Petersen  57 y.o. female  PRE-OPERATIVE DIAGNOSIS:  Right knee medial mensical tear  POST-OPERATIVE DIAGNOSIS:  Right knee medial mensical tear  PROCEDURE:  Procedure(s): KNEE ARTHROSCOPY WITH PARITAL  MEDIAL MENISECTOMY (Right)  SURGEON:  Surgeons and Role:    * Yolonda Kida, MD - Primary  PHYSICIAN ASSISTANT: Zadie Cleverly, PA-C  ANESTHESIA:   local and general  EBL:  10 mL   BLOOD ADMINISTERED:none  DRAINS: none   LOCAL MEDICATIONS USED:  MARCAINE     SPECIMEN:  No Specimen  DISPOSITION OF SPECIMEN:  N/A  COUNTS:  YES  TOURNIQUET:  * No tourniquets in log *  DICTATION: .Note written in EPIC  PLAN OF CARE: Discharge to home after PACU  PATIENT DISPOSITION:  PACU - hemodynamically stable.   Delay start of Pharmacological VTE agent (>24hrs) due to surgical blood loss or risk of bleeding: not applicable

## 2023-06-01 NOTE — Anesthesia Procedure Notes (Signed)
Procedure Name: LMA Insertion Date/Time: 06/01/2023 12:28 PM  Performed by: Cleda Clarks, CRNAPre-anesthesia Checklist: Patient identified, Emergency Drugs available, Suction available and Patient being monitored Patient Re-evaluated:Patient Re-evaluated prior to induction Oxygen Delivery Method: Circle system utilized Preoxygenation: Pre-oxygenation with 100% oxygen Induction Type: IV induction Ventilation: Mask ventilation without difficulty LMA: LMA inserted LMA Size: 4.0 Number of attempts: 1 Placement Confirmation: positive ETCO2 Tube secured with: Tape Dental Injury: Teeth and Oropharynx as per pre-operative assessment

## 2023-06-01 NOTE — Anesthesia Postprocedure Evaluation (Signed)
Anesthesia Post Note  Patient: Nicole Petersen  Procedure(s) Performed: KNEE ARTHROSCOPY WITH PARITAL  MEDIAL MENISECTOMY (Right: Knee)     Patient location during evaluation: PACU Anesthesia Type: General Level of consciousness: awake and alert Pain management: pain level controlled Vital Signs Assessment: post-procedure vital signs reviewed and stable Respiratory status: spontaneous breathing, nonlabored ventilation and respiratory function stable Cardiovascular status: blood pressure returned to baseline Postop Assessment: no apparent nausea or vomiting Anesthetic complications: no   No notable events documented.  Last Vitals:  Vitals:   06/01/23 1315 06/01/23 1330  BP: (!) 148/94 (!) 145/94  Pulse: 83 74  Resp: 14 12  Temp:    SpO2: 96% 95%    Last Pain:  Vitals:   06/01/23 1330  TempSrc:   PainSc: 0-No pain                 Shanda Howells

## 2023-06-01 NOTE — Transfer of Care (Signed)
Immediate Anesthesia Transfer of Care Note  Patient: Nicole Petersen  Procedure(s) Performed: KNEE ARTHROSCOPY WITH PARITAL  MEDIAL MENISECTOMY (Right: Knee)  Patient Location: PACU  Anesthesia Type:General  Level of Consciousness: awake, alert , and oriented  Airway & Oxygen Therapy: Patient Spontanous Breathing and Patient connected to face mask oxygen  Post-op Assessment: Report given to RN and Post -op Vital signs reviewed and stable  Post vital signs: Reviewed and stable  Last Vitals:  Vitals Value Taken Time  BP 140/89 06/01/23 1300  Temp    Pulse 86 06/01/23 1302  Resp 14 06/01/23 1302  SpO2 99 % 06/01/23 1302  Vitals shown include unfiled device data.  Last Pain:  Vitals:   06/01/23 1049  TempSrc: Temporal  PainSc: 7       Patients Stated Pain Goal: 3 (06/01/23 1049)  Complications: No notable events documented.

## 2023-06-01 NOTE — Discharge Instructions (Addendum)
Post-operative patient instructions  Knee Arthroscopy   Ice:  Place intermittent ice or cooler pack over your knee, 30 minutes on and 30 minutes off.  Continue this for the first 72 hours after surgery, then save ice for use after therapy sessions or on more active days.   Weight:  You may bear weight on your leg as your symptoms allow. DVT prevention: Perform ankle pumps as able throughout the day on the operative extremity.  Be mobile as possible with ambulation as able.  You should also take an 81 mg aspirin once per day x6 weeks. Crutches:  Use crutches (or walker) to assist in walking until told to discontinue by your physical therapist or physician. This will help to reduce pain. Strengthening:  Perform simple thigh squeezes (isometric quad contractions) and straight leg lifts as you are able (3 sets of 5 to 10 repetitions, 3 times a day).  For the leg lifts, have someone support under your ankle in the beginning until you have increased strength enough to do this on your own.  To help get started on thigh squeezes, place a pillow under your knee and push down on the pillow with back of knee (sometimes easier to do than with your leg fully straight). Motion:  Perform gentle knee motion as tolerated - this is gentle bending and straightening of the knee. Seated heel slides: you can start by sitting in a chair, remove your brace, and gently slide your heel back on the floor - allowing your knee to bend. Have someone help you straighten your knee (or use your other leg/foot hooked under your ankle.  Dressing:  Perform 1st dressing change at 3 days postoperative. A moderate amount of blood tinged drainage is to be expected.  So if you bleed through the dressing on the first or second day or if you have fevers, it is fine to change the dressing/check the wounds early and redress wound. Elevate your leg.  If it bleeds through again, or if the incisions are leaking frank blood, please call the office. May  change dressing every 1-2 days thereafter to help watch wounds. Can purchase Tegderm (or 96M Nexcare) water resistant dressings at local pharmacy / [facility]. Shower:  Light shower is ok after 3 days.  Please take shower, NO bath. Recover with gauze and ace wrap to help keep wounds protected.   Pain medication:  A narcotic pain medication has been prescribed.  Take as directed.  Typically you need narcotic pain medication more regularly during the first 3 to 5 days after surgery.  Decrease your use of the medication as the pain improves.  Narcotics can sometimes cause constipation, even after a few doses.  If you have problems with constipation, you can take an over the counter stool softener or light laxative.  If you have persistent problems, please notify your physician's office. Physical therapy: Additional activity guidelines to be provided by your physician or physical therapist at follow-up visits.  Driving: Do not recommend driving x 1-2 weeks post surgical, especially if surgery performed on right side. Should not drive while taking narcotic pain medications. It typically takes at least 2 weeks to restore sufficient neuromuscular function for normal reaction times for driving safety.  Call [phone] for questions or problems. Evenings you will be forwarded to the hospital operator.  Ask for the orthopaedic physician on call. Please call if you experience:    Redness, foul smelling, or persistent drainage from the surgical site  worsening knee pain and  swelling not responsive to medication  any calf pain and or swelling of the lower leg  temperatures greater than 101.5 F other questions or concerns   Thank you for allowing [place] to be a part of your care    Post Anesthesia Home Care Instructions  Activity: Get plenty of rest for the remainder of the day. A responsible individual must stay with you for 24 hours following the procedure.  For the next 24 hours, DO NOT: -Drive a car -Social worker -Drink alcoholic beverages -Take any medication unless instructed by your physician -Make any legal decisions or sign important papers.  Meals: Start with liquid foods such as gelatin or soup. Progress to regular foods as tolerated. Avoid greasy, spicy, heavy foods. If nausea and/or vomiting occur, drink only clear liquids until the nausea and/or vomiting subsides. Call your physician if vomiting continues.  Special Instructions/Symptoms: Your throat may feel dry or sore from the anesthesia or the breathing tube placed in your throat during surgery. If this causes discomfort, gargle with warm salt water. The discomfort should disappear within 24 hours.  If you had a scopolamine patch placed behind your ear for the management of post- operative nausea and/or vomiting:  1. The medication in the patch is effective for 72 hours, after which it should be removed.  Wrap patch in a tissue and discard in the trash. Wash hands thoroughly with soap and water. 2. You may remove the patch earlier than 72 hours if you experience unpleasant side effects which may include dry mouth, dizziness or visual disturbances. 3. Avoid touching the patch. Wash your hands with soap and water after contact with the patch.    May take Tylenol after 8:40pm if needed.

## 2023-06-01 NOTE — H&P (Signed)
ORTHOPAEDIC H&P  REQUESTING PHYSICIAN: Yolonda Kida, MD  PCP:  Genia Hotter, FNP  Chief Complaint: Right knee pain  HPI: Nicole Petersen is a 57 y.o. female who complains of right knee pain and swelling.  Here today for arthroscopic assisted partial medial meniscectomy.  No new complaints at this time.  Previously discussed this procedure in detail in the office.  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)    Past Surgical History:  Procedure Laterality Date   CESAREAN SECTION     WISDOM TOOTH EXTRACTION     Social History   Socioeconomic History   Marital status: Married    Spouse name: Not on file   Number of children: Not on file   Years of education: Not on file   Highest education level: Not on file  Occupational History   Not on file  Tobacco Use   Smoking status: Never   Smokeless tobacco: Never  Vaping Use   Vaping status: Never Used  Substance and Sexual Activity   Alcohol use: No    Alcohol/week: 0.0 standard drinks of alcohol   Drug use: No   Sexual activity: Not on file  Other Topics Concern   Not on file  Social History Narrative   Lives with husband in a one story home but does have to go up a flight of stairs to get into house.  Has one son in the [employer].     Works as a Airline pilot.  Education: high school.   Social Determinants of Health   Financial Resource Strain: Not on file  Food Insecurity: Not on file  Transportation Needs: Not on file  Physical Activity: Not on file  Stress: Not on file  Social Connections: Unknown (12/26/2021)   Received from [facility]   Social Network    Social Network: Not on file   Family History  Problem Relation Age of Onset   Heart disease Father        Deceased   Diabetes  Mother    Heart disease Mother        Deceased   Hypertension Mother    Lymphoma Brother        Deceased   Healthy Sister    Healthy Brother    Healthy Son    No Known Allergies Prior to Admission medications   Medication Sig Start Date End Date Taking? Authorizing Provider  Abatacept (ORENCIA IV) Inject into the vein every 30 (thirty) days.   Yes [provider]  DULoxetine (CYMBALTA) 30 MG capsule Take 30 mg by mouth 2 (two) times daily.   Yes [provider]  gabapentin (NEURONTIN) 300 MG capsule Take 300 mg by mouth at bedtime.   Yes [provider]  rosuvastatin (CRESTOR) 5 MG tablet Take 5 mg by mouth daily.   Yes [provider]   No results found.  Positive ROS: All other systems have been reviewed and were otherwise negative with the exception of those mentioned in the HPI and as above.  Physical Exam: General: Alert, no acute distress Cardiovascular: No pedal edema Respiratory: No cyanosis, no use of accessory musculature GI: No organomegaly, abdomen is soft and non-tender Skin: No lesions in the area of  chief complaint Neurologic: Sensation intact distally Psychiatric: Patient is competent for consent with normal mood and affect Lymphatic: No axillary or cervical lymphadenopathy  MUSCULOSKELETAL: Right lower extremity is warm and well-perfused with no open wounds or lesions.  She is neUrovascular intact throughout.  Assessment: 1.  Right knee medial meniscus tear, acute initial encounter. 2.  Right knee chondromalacia, mild  Plan: -Plan is to proceed today with arthroscopic assisted partial medial meniscectomy.  We again discussed the risk but of the procedure in detail.  The risk of bleeding, infection, damage to surrounding nerves and vessels, stiffness, progression of arthritis, potential need for further surgery, as well as the risk of DVT and the risk of anesthesia.  She has provided informed consent.    Discharge home from  PACU postop    Yolonda Kida, MD Cell [phone]    06/01/2023 10:39 AM

## 2023-06-01 NOTE — Anesthesia Preprocedure Evaluation (Addendum)
Anesthesia Evaluation  Patient identified by MRN, date of birth, ID band Patient awake    Reviewed: Allergy & Precautions, NPO status , Patient's Chart, lab work & pertinent test results  History of Anesthesia Complications Negative for: history of anesthetic complications  Airway Mallampati: I  TM Distance: >3 FB Neck ROM: Full    Dental no notable dental hx.    Pulmonary neg pulmonary ROS   Pulmonary exam normal        Cardiovascular negative cardio ROS Normal cardiovascular exam     Neuro/Psych   Anxiety        GI/Hepatic negative GI ROS, Neg liver ROS,,,  Endo/Other  negative endocrine ROS    Renal/GU negative Renal ROS     Musculoskeletal  (+) Arthritis , Rheumatoid disorders,  Right knee medial mensical tear   Abdominal   Peds  Hematology negative hematology ROS (+)   Anesthesia Other Findings   Reproductive/Obstetrics                             Anesthesia Physical Anesthesia Plan  ASA: 2  Anesthesia Plan: General   Post-op Pain Management: Tylenol PO (pre-op)*   Induction: Intravenous  PONV Risk Score and Plan: 3 and Treatment may vary due to age or medical condition, Ondansetron, Dexamethasone, Midazolam and Scopolamine patch - Pre-op  Airway Management Planned: LMA  Additional Equipment: None  Intra-op Plan:   Post-operative Plan: Extubation in OR  Informed Consent: I have reviewed the patients History and Physical, chart, labs and discussed the procedure including the risks, benefits and alternatives for the proposed anesthesia with the patient or authorized representative who has indicated his/her understanding and acceptance.     Dental advisory given  Plan Discussed with: CRNA  Anesthesia Plan Comments:        Anesthesia Quick Evaluation

## 2023-06-04 ENCOUNTER — Encounter (HOSPITAL_BASED_OUTPATIENT_CLINIC_OR_DEPARTMENT_OTHER): Payer: Self-pay | Admitting: Orthopedic Surgery

## 2023-06-04 NOTE — Plan of Care (Signed)
[facility] Tonsillectomy/Adenoidectomy, Postoperative PEDS care plan entered in error.

## 2023-06-07 NOTE — Therapy (Signed)
OUTPATIENT PHYSICAL THERAPY LOWER EXTREMITY EVALUATION   Patient Name: Nicole Petersen MRN: [id number] DOB:1965/11/20, 57 y.o., female Today's Date: 06/08/2023  END OF SESSION:  PT End of Session - 06/08/23 0846     Visit Number 1    Number of Visits 13    Date for PT Re-Evaluation 07/21/23    Authorization Type [employer]    PT Start Time [phone]    PT Stop Time 0930    PT Time Calculation (min) 44 min    Activity Tolerance Patient tolerated treatment well    Behavior During Therapy WFL for tasks assessed/performed             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   PCP: Stamey, Verda Cumins, FNP  REFERRING PROVIDER: Yolonda Kida, MD   REFERRING DIAG: [phone] (ICD-10-CM) - Encounter for other orthopedic aftercare   THERAPY DIAG:  Acute pain of right knee  Muscle weakness (generalized)  Localized edema  Stiffness of right knee, not elsewhere classified  Rationale for Evaluation and Treatment: Rehabilitation  ONSET DATE: 06/01/23  SUBJECTIVE:   SUBJECTIVE STATEMENT: Patient reports she has been doing well since her Rt knee meniscectomy on 06/01/23. Her pain has been ok, sleeping is still an issue because it is hard to get uncomfortable. She reports it is hard to fall asleep due to this discomfort. She is able to reciprocally ascend stairs, but sometimes has to  step to on the descent. She has not been squatting or doing heavy household activity. She started walking for exercise for about 1/4 mile. Prior she was walking 2 miles a day for exercise.   PERTINENT HISTORY: Rt knee partial medial meniscectomy 06/01/23 Rheumatoid arthritis  Self reports Rt calf strain >10 years ago PAIN:  Are you having pain? Yes: NPRS scale: 5 /10 Pain location: Rt knee  Pain description: stiffness Aggravating factors: nighttime; sleep Relieving factors: ice, reclining   PRECAUTIONS: Knee  RED FLAGS: None   WEIGHT BEARING RESTRICTIONS: Yes WBAT RLE  FALLS:  Has patient fallen in last 6 months? No  LIVING ENVIRONMENT: Lives with: lives with their spouse Lives in: House/apartment Stairs: Yes: Internal: flight steps; can reach both Has following equipment at home: None  OCCUPATION: disability   PLOF: Independent  PATIENT GOALS: "get rid of the stiffness."  NEXT MD VISIT: 06/19/23  OBJECTIVE:  Note: Objective measures were completed at Evaluation unless otherwise noted.  DIAGNOSTIC FINDINGS: none on file   PATIENT SURVEYS:  FOTO 52% function to 77% predicted  COGNITION: Overall cognitive status: Within functional limits for tasks assessed     SENSATION: Not tested  EDEMA:  Mild non-pitting edema about Rt knee     POSTURE:  genu varum  PALPATION: Diffuse tenderness about Rt knee   LOWER EXTREMITY ROM:  Active ROM Right eval Left eval  Hip  flexion    Hip extension    Hip abduction    Hip adduction    Hip internal rotation    Hip external rotation    Knee flexion 108 135  Knee extension 0   Ankle dorsiflexion    Ankle plantarflexion    Ankle inversion    Ankle eversion     (Blank rows = not tested)  LOWER EXTREMITY MMT:  MMT Right eval Left eval  Hip flexion 4+ 5  Hip extension    Hip abduction 4 4  Hip adduction    Hip internal rotation    Hip external rotation    Knee flexion 4 pn 5  Knee extension 4 5  Ankle  dorsiflexion    Ankle plantarflexion    Ankle inversion    Ankle eversion     (Blank rows = not tested)  LOWER EXTREMITY SPECIAL TESTS:  N/A  FUNCTIONAL TESTS:  N/A  GAIT: Distance walked: 10 ft  Assistive device utilized: None Level of assistance: Complete Independence Comments: no obvious gait abnormalities    [facility] Adult PT Treatment:                                                DATE: 06/08/23 Therapeutic Exercise: Demonstrated and issue initial HEP.  Modalities: Game ready to Rt knee x 10 minutes; 34 degrees, low compression  Self Care: Ice for pain and swelling at home     PATIENT EDUCATION:  Education details: see treatment  Person educated: Patient Education method: Explanation, Demonstration, Tactile cues, Verbal cues, and Handouts Education comprehension: verbalized understanding, returned demonstration, verbal cues required, tactile cues required, and needs further education  HOME EXERCISE PROGRAM: Access Code: [email or web address] URL: [email or web address] Date: 06/08/2023 Prepared by: Letitia Libra  Exercises - Long Sitting Quad Set  - 2 x daily - 7 x weekly - 2 sets - 10 reps - 5 sec  hold - Supine Hamstring Stretch with Strap  - 2 x daily - 7 x weekly - 3 sets - 20-30 sec  hold - Supine Heel Slide  - 2 x daily - 7 x weekly - 2 sets - 10 reps - 5 sec  hold - Seated Knee Flexion AAROM  - 2 x daily - 7 x weekly - 2 sets - 10 reps - 5 sec  hold - Seated Long Arc Quad  - 2 x daily - 7 x weekly - 2 sets - 10 reps - Heel Raises with Counter Support  - 1 x daily - 7 x weekly - 2 sets - 10 reps  ASSESSMENT:  CLINICAL IMPRESSION: Patient is a 57 y.o. female who was seen today for physical therapy evaluation and treatment for s/p Rt medial meniscectomy on 06/01/23. She demonstrates ROM and strength deficits as well as reporting activity limitations (prolonged walking, squatting, stair negotiation) that are consistent with her recent post-operative status.  She will benefit from skilled PT to address the above stated deficits in order to return to optimal function.   OBJECTIVE IMPAIRMENTS: decreased activity tolerance, decreased endurance, decreased knowledge of condition, decreased mobility, difficulty walking, decreased ROM, decreased strength, increased edema, impaired flexibility, improper body mechanics, postural dysfunction, and pain.   ACTIVITY LIMITATIONS: carrying, lifting, bending, standing, squatting, stairs, transfers, and locomotion level  PARTICIPATION LIMITATIONS: meal prep, cleaning, laundry, shopping, community activity, and yard work  PERSONAL FACTORS: Age,  Fitness, Time since onset of injury/illness/exacerbation, and 1-2 comorbidities: recent surgery; RA  are also affecting patient's functional outcome.   REHAB POTENTIAL: Good  CLINICAL DECISION MAKING: Stable/uncomplicated  EVALUATION COMPLEXITY: Low   GOALS: Goals reviewed with patient? Yes  SHORT TERM GOALS: Target date: 06/28/2023   Patient will be independent and compliant with initial HEP.   Baseline: issued at eval  Goal status: INITIAL  2.  Patient will demonstrate at least 120 degrees of Rt knee flexion AROM to improve ability to complete sit to stand transfers.  Baseline: see above Goal status: INITIAL  3.  Patient will complete Rt SLR without quad lag indicative of improved knee stability.  Baseline: visible shaking with quad set  Goal status: INITIAL    LONG TERM GOALS: Target date: 07/21/23  Patient will demonstrate proper squat mechanics without knee pain.  Baseline: avoids squatting currently Goal status: INITIAL  2.  Patient will be able to ascend/descend stairs with reciprocal pattern.  Baseline: see subjective Goal status: INITIAL  3.  Patient will return to previous exercise regimen without limitations as it relates to her knee.  Baseline: currently walking 1/4 mile; previously walking 2 miles daily  Goal status: INITIAL  4.   Patient will report pain at worst rated as </= 2/10 to improve sleep quality.  Baseline: see above Goal status: INITIAL  5.  Patient will be independent with advanced home program to progress/maintain current level of function.  Baseline:  Goal status: INITIAL   PLAN:  PT FREQUENCY: 2x/week  PT DURATION: 6 weeks  PLANNED INTERVENTIONS: 97164- PT Re-evaluation, 97110-Therapeutic exercises, 97530- Therapeutic activity, [id number]- Neuromuscular re-education, 97535- Self Care, 25956- Manual therapy, [id number]- Gait training, [id number]- Aquatic Therapy, 97014- Electrical stimulation (unattended), [id number]- Electrical stimulation (manual), 97016- Vasopneumatic device, Taping, Dry Needling, Cryotherapy, and Moist heat  PLAN FOR NEXT SESSION: review and progress HEP prn. Knee flexion ROM. Quad strengthening (SAQ, SLR, TKE); glute strengthening; game ready.    Letitia Libra, PT, DPT, ATC 06/08/23 9:40 AM

## 2023-06-08 ENCOUNTER — Ambulatory Visit: Payer: Managed Care, Other (non HMO) | Attending: Orthopedic Surgery

## 2023-06-08 ENCOUNTER — Other Ambulatory Visit: Payer: Self-pay

## 2023-06-08 DIAGNOSIS — R600 Localized edema: Secondary | ICD-10-CM | POA: Diagnosis present

## 2023-06-08 DIAGNOSIS — M6281 Muscle weakness (generalized): Secondary | ICD-10-CM | POA: Insufficient documentation

## 2023-06-08 DIAGNOSIS — M25561 Pain in right knee: Secondary | ICD-10-CM | POA: Diagnosis not present

## 2023-06-08 DIAGNOSIS — Z4789 Encounter for other orthopedic aftercare: Secondary | ICD-10-CM | POA: Insufficient documentation

## 2023-06-08 DIAGNOSIS — M25661 Stiffness of right knee, not elsewhere classified: Secondary | ICD-10-CM | POA: Diagnosis not present

## 2023-06-11 ENCOUNTER — Ambulatory Visit: Payer: Managed Care, Other (non HMO)

## 2023-06-11 DIAGNOSIS — R600 Localized edema: Secondary | ICD-10-CM

## 2023-06-11 DIAGNOSIS — M25661 Stiffness of right knee, not elsewhere classified: Secondary | ICD-10-CM

## 2023-06-11 DIAGNOSIS — M25561 Pain in right knee: Secondary | ICD-10-CM

## 2023-06-11 DIAGNOSIS — Z4789 Encounter for other orthopedic aftercare: Secondary | ICD-10-CM | POA: Diagnosis not present

## 2023-06-11 DIAGNOSIS — M6281 Muscle weakness (generalized): Secondary | ICD-10-CM

## 2023-06-11 NOTE — Therapy (Signed)
OUTPATIENT PHYSICAL THERAPY LOWER EXTREMITY TREATMENT   Patient Name: Nicole Petersen MRN: [id number] DOB:[birth year], 57 y.o., female Today's Date: 06/11/2023  END OF SESSION:  PT End of Session - 06/11/23 0844     Visit Number 2    Number of Visits 13    Date for PT Re-Evaluation 07/21/23    Authorization Type [employer]    PT Start Time [phone]    PT Stop Time 0933    PT Time Calculation (min) 49 min    Activity Tolerance Patient tolerated treatment well    Behavior During Therapy WFL for tasks assessed/performed              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   PCP: Stamey, Verda Cumins, FNP  REFERRING PROVIDER: Yolonda Kida, MD   REFERRING DIAG: [id number] (ICD-10-CM) - Encounter for other orthopedic aftercare   THERAPY DIAG:  Acute pain of right knee  Muscle weakness (generalized)  Localized edema  Stiffness of right knee, not elsewhere classified  Rationale for Evaluation and Treatment: Rehabilitation  ONSET DATE: 06/01/23  SUBJECTIVE:   SUBJECTIVE STATEMENT: Has started walking more in the neighborhood (1/2 mile). Exercises are going well. No pain right now.   PERTINENT HISTORY: Rt knee partial medial meniscectomy 06/01/23 Rheumatoid arthritis  Self reports Rt calf strain >10 years ago PAIN:  Are you having pain? No   PRECAUTIONS:  Knee   WEIGHT BEARING RESTRICTIONS: Yes WBAT RLE  FALLS:  Has patient fallen in last 6 months? No   PATIENT GOALS: "get rid of the stiffness."  NEXT MD VISIT: 06/19/23  OBJECTIVE:  Note: Objective measures were completed at Evaluation unless otherwise noted.  DIAGNOSTIC FINDINGS: none on file   PATIENT SURVEYS:  FOTO 52% function to 77% predicted  COGNITION: Overall cognitive status: Within functional limits for tasks assessed     SENSATION: Not tested  EDEMA:  Mild non-pitting edema about Rt knee     POSTURE:  genu varum  PALPATION: Diffuse tenderness about Rt knee   LOWER EXTREMITY ROM:  Active ROM Right eval Left eval 06/11/23 Right  Hip flexion     Hip extension     Hip abduction     Hip adduction     Hip internal rotation     Hip external rotation     Knee flexion 108 135 125  Knee extension 0    Ankle dorsiflexion     Ankle plantarflexion     Ankle inversion     Ankle eversion      (Blank rows = not tested)  LOWER EXTREMITY MMT:  MMT Right eval Left eval  Hip flexion 4+ 5  Hip extension    Hip abduction 4 4  Hip adduction    Hip internal rotation    Hip external rotation    Knee flexion  4 pn 5  Knee extension 4 5  Ankle dorsiflexion    Ankle plantarflexion    Ankle inversion    Ankle eversion     (Blank rows = not tested)  LOWER EXTREMITY SPECIAL TESTS:  N/A  FUNCTIONAL TESTS:  N/A  GAIT: Distance walked: 10 ft  Assistive device utilized: None Level of assistance: Complete Independence Comments: no obvious gait abnormalities   [facility] Adult PT Treatment:                                                DATE: 06/11/23 Therapeutic Exercise: Recumbent bike no resistance fwd revolutions x 3 minutes  Heel slides x 10; 5 sec hold  SLR 2 x 10  Hip bridge 2 x 10  Sidelying hip abduction 2 x 10  Resisted HS curl red band 2 x 10  TKE with ball at wall 2 x 10  LAQ 2 x 10 @ 1 lb  Leg press 2 x 10 @ 45 lbs  HS stretch with strap 2  x 30 sec  Updated HEP   Modalities: Game ready to Rt knee x 10 minutes; 34 degrees, low compression    [facility] Adult PT Treatment:                                                DATE: 06/08/23 Therapeutic Exercise: Demonstrated and issue initial HEP.  Modalities: Game ready to Rt knee x 10 minutes; 34 degrees, low compression  Self Care: Ice for pain and swelling at home     PATIENT EDUCATION:  Education details: HEP update Person educated: Patient Education method: Explanation, Demonstration, Tactile cues, Verbal cues, and Handouts Education comprehension: verbalized understanding, returned demonstration, verbal cues required, tactile cues required, and needs further education  HOME EXERCISE PROGRAM: Access Code: [email or web address] URL: [email or web address] Date: 06/11/2023 Prepared by: Letitia Libra  Exercises - Supine Hamstring Stretch with Strap  - 2 x daily - 7 x weekly - 3 sets - 20-30 sec  hold - Supine Heel Slide  - 2 x daily - 7 x weekly - 2 sets - 10 reps - 5 sec  hold - Seated Knee Flexion AAROM  - 2 x daily - 7 x weekly - 2 sets - 10 reps - 5 sec  hold - Seated Long Arc Quad  - 2 x daily - 7 x weekly - 2 sets - 10 reps - Heel Raises with Counter Support  - 1 x daily - 7 x weekly - 2 sets - 10 reps - Supine Active Straight Leg Raise  - 1 x daily - 7 x weekly - 2 sets - 10 reps - Supine Bridge  - 1 x daily - 7 x weekly - 2 sets - 10 reps  ASSESSMENT:  CLINICAL IMPRESSION: Patient arrives without reports of pain. Knee flexion AROM has already improved compared to evaluation, achieving 125 degrees today. No quad lag present with SLR, STG #3 met. Focused on knee and gluteal strengthening today with good tolerance. HEP was updated to include further strengthening.   OBJECTIVE IMPAIRMENTS: decreased activity tolerance, decreased endurance, decreased knowledge of condition, decreased mobility, difficulty walking, decreased ROM, decreased strength, increased edema,  impaired flexibility, improper body mechanics, postural dysfunction,  and pain.   ACTIVITY LIMITATIONS: carrying, lifting, bending, standing, squatting, stairs, transfers, and locomotion level  PARTICIPATION LIMITATIONS: meal prep, cleaning, laundry, shopping, community activity, and yard work  PERSONAL FACTORS: Age, Fitness, Time since onset of injury/illness/exacerbation, and 1-2 comorbidities: recent surgery; RA  are also affecting patient's functional outcome.   REHAB POTENTIAL: Good  CLINICAL DECISION MAKING: Stable/uncomplicated  EVALUATION COMPLEXITY: Low   GOALS: Goals reviewed with patient? Yes  SHORT TERM GOALS: Target date: 06/28/2023   Patient will be independent and compliant with initial HEP.   Baseline: issued at eval  Goal status: INITIAL  2.  Patient will demonstrate at least 120 degrees of Rt knee flexion AROM to improve ability to complete sit to stand transfers.  Baseline: see above Goal status: Met  3.  Patient will complete Rt SLR without quad lag indicative of improved knee stability.  Baseline: visible shaking with quad set  Goal status: Met     LONG TERM GOALS: Target date: 07/21/23  Patient will demonstrate proper squat mechanics without knee pain.  Baseline: avoids squatting currently Goal status: INITIAL  2.  Patient will be able to ascend/descend stairs with reciprocal pattern.  Baseline: see subjective Goal status: INITIAL  3.  Patient will return to previous exercise regimen without limitations as it relates to her knee.  Baseline: currently walking 1/4 mile; previously walking 2 miles daily  Goal status: INITIAL  4.  Patient will report pain at worst rated as </= 2/10 to improve sleep quality.  Baseline: see above Goal status: INITIAL  5.  Patient will be independent with advanced home program to progress/maintain current level of function.  Baseline:  Goal status: INITIAL   PLAN:  PT FREQUENCY: 2x/week  PT DURATION: 6  weeks  PLANNED INTERVENTIONS: 97164- PT Re-evaluation, 97110-Therapeutic exercises, 97530- Therapeutic activity, [id number]- Neuromuscular re-education, 97535- Self Care, [id number]- Manual therapy, [id number]- Gait training, [id number]- Aquatic Therapy, 97014- Electrical stimulation (unattended), [id number]- Electrical stimulation (manual), 97016- Vasopneumatic device, Taping, Dry Needling, Cryotherapy, and Moist heat  PLAN FOR NEXT SESSION: review and progress HEP prn. Knee flexion ROM. Quad strengthening, glute strengthening; game ready.    Letitia Libra, PT, DPT, ATC 06/11/23 10:11 AM

## 2023-06-14 ENCOUNTER — Encounter: Payer: Self-pay | Admitting: Physical Therapy

## 2023-06-14 ENCOUNTER — Ambulatory Visit: Payer: Managed Care, Other (non HMO) | Admitting: Physical Therapy

## 2023-06-14 DIAGNOSIS — M6281 Muscle weakness (generalized): Secondary | ICD-10-CM

## 2023-06-14 DIAGNOSIS — Z4789 Encounter for other orthopedic aftercare: Secondary | ICD-10-CM | POA: Diagnosis not present

## 2023-06-14 DIAGNOSIS — M25661 Stiffness of right knee, not elsewhere classified: Secondary | ICD-10-CM

## 2023-06-14 DIAGNOSIS — R600 Localized edema: Secondary | ICD-10-CM

## 2023-06-14 DIAGNOSIS — M25561 Pain in right knee: Secondary | ICD-10-CM

## 2023-06-14 NOTE — Therapy (Signed)
OUTPATIENT PHYSICAL THERAPY LOWER EXTREMITY TREATMENT   Patient Name: Nicole Petersen MRN: [id number] DOB:10-10-1965, 57 y.o., female Today's Date: 06/14/2023  END OF SESSION:  PT End of Session - 06/14/23 0918     Visit Number 3    Number of Visits 13    Date for PT Re-Evaluation 07/21/23    Authorization Type [employer]    PT Start Time 0845    PT Stop Time 0928    PT Time Calculation (min) 43 min    Activity Tolerance Patient tolerated treatment well    Behavior During Therapy WFL for tasks assessed/performed               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   PCP: Stamey, Verda Cumins, FNP  REFERRING PROVIDER: Yolonda Kida, MD   REFERRING DIAG: [id number] (ICD-10-CM) - Encounter for other orthopedic aftercare   THERAPY DIAG:  Acute pain of right knee  Muscle weakness (generalized)  Localized edema  Stiffness of right knee, not elsewhere classified  Rationale for Evaluation and Treatment: Rehabilitation  ONSET DATE: 06/01/23  SUBJECTIVE:   SUBJECTIVE STATEMENT: Pt states going up stairs is good, going down stairs is getting better  PERTINENT HISTORY: Rt knee partial medial meniscectomy 06/01/23 Rheumatoid arthritis  Self reports Rt calf strain >10 years ago PAIN:  Are you having pain? No   PRECAUTIONS: Knee   WEIGHT BEARING RESTRICTIONS: Yes  WBAT RLE  FALLS:  Has patient fallen in last 6 months? No   PATIENT GOALS: "get rid of the stiffness."  NEXT MD VISIT: 06/19/23  OBJECTIVE:  Note: Objective measures were completed at Evaluation unless otherwise noted.  DIAGNOSTIC FINDINGS: none on file   PATIENT SURVEYS:  FOTO 52% function to 77% predicted  COGNITION: Overall cognitive status: Within functional limits for tasks assessed     SENSATION: Not tested  EDEMA:  Mild non-pitting edema about Rt knee     POSTURE:  genu varum  PALPATION: Diffuse tenderness about Rt knee   LOWER EXTREMITY ROM:  Active ROM Right eval Left eval 06/11/23 Right  Hip flexion     Hip extension     Hip abduction     Hip adduction     Hip internal rotation     Hip external rotation     Knee flexion 108 135 125  Knee extension 0    Ankle dorsiflexion     Ankle plantarflexion     Ankle inversion     Ankle eversion      (Blank rows = not tested)  LOWER EXTREMITY MMT:  MMT Right eval Left eval  Hip flexion 4+ 5  Hip extension    Hip abduction 4 4  Hip adduction    Hip internal rotation    Hip external rotation    Knee flexion 4 pn 5  Knee extension 4 5  Ankle dorsiflexion    Ankle plantarflexion    Ankle inversion    Ankle eversion     (Blank rows = not tested)  LOWER EXTREMITY SPECIAL TESTS:  N/A  FUNCTIONAL TESTS:  N/A  GAIT: Distance walked: 10 ft  Assistive device utilized: None Level of assistance: Complete Independence Comments: no obvious gait abnormalities   [facility] Adult PT Treatment:                                                DATE: 06/14/23 Therapeutic Exercise: Recumbent L1 x 5 min Hip bridge 3 x 10 SLR red TB 3 x 10 Sidelying hip abd 3 x 10 Tandem stance on foam 2 x 30 sec - intermittent UE support Rocker board static x 1 min each A/P and laterally - intermittent UE support Leg press 3 x 10 @ 45 lbs  Seated LAQ and HS curl red TB 3 x 10  Modalities: Game ready to Rt knee x 10  minutes; 34 degrees, low compression    [facility] Adult PT Treatment:                                                DATE: 06/11/23 Therapeutic Exercise: Recumbent bike no resistance fwd revolutions x 3 minutes  Heel slides x 10; 5 sec hold  SLR 2 x 10  Hip bridge 2 x 10  Sidelying hip abduction 2 x 10  Resisted HS curl red band 2 x 10  TKE with ball at wall 2 x 10  LAQ 2 x 10 @ 1 lb  Leg press 2 x 10 @ 45 lbs  HS stretch with strap 2 x 30 sec  Updated HEP   Modalities: Game ready to Rt knee x 10 minutes; 34 degrees, low compression    [facility] Adult PT Treatment:                                                DATE: 06/08/23 Therapeutic Exercise: Demonstrated and issue initial HEP.  Modalities: Game ready to Rt knee x 10 minutes; 34 degrees, low compression  Self Care: Ice for pain and swelling at home     PATIENT EDUCATION:  Education details: HEP update Person educated: Patient Education method: Explanation, Demonstration, Tactile cues, Verbal cues, and Handouts Education comprehension: verbalized understanding, returned demonstration, verbal cues required, tactile cues required, and needs further education  HOME EXERCISE PROGRAM: Access Code: [email or web address] URL: [email or web address] Date: 06/11/2023 Prepared by: Letitia Libra  Exercises - Supine Hamstring Stretch with Strap  - 2 x daily - 7 x weekly - 3 sets - 20-30 sec  hold - Supine Heel Slide  - 2 x daily - 7 x weekly - 2 sets - 10 reps - 5 sec  hold - Seated Knee Flexion AAROM  - 2 x daily - 7 x weekly - 2 sets - 10 reps - 5 sec  hold - Seated Long Arc Quad  - 2 x daily - 7 x weekly - 2 sets - 10 reps - Heel Raises with Counter Support  -  1 x daily - 7 x weekly - 2 sets - 10 reps - Supine Active Straight Leg Raise  - 1 x daily - 7 x weekly - 2 sets - 10 reps - Supine Bridge  - 1 x daily - 7 x weekly - 2 sets - 10 reps  ASSESSMENT:  CLINICAL IMPRESSION: Pt with good response to increase in sets today to  challenge endurance. She did good with addition of balance work, only requires intermittent UE support and no increased pain  OBJECTIVE IMPAIRMENTS: decreased activity tolerance, decreased endurance, decreased knowledge of condition, decreased mobility, difficulty walking, decreased ROM, decreased strength, increased edema, impaired flexibility, improper body mechanics, postural dysfunction, and pain.     GOALS: Goals reviewed with patient? Yes  SHORT TERM GOALS: Target date: 06/28/2023   Patient will be independent and compliant with initial HEP.   Baseline: issued at eval  Goal status: INITIAL  2.  Patient will demonstrate at least 120 degrees of Rt knee flexion AROM to improve ability to complete sit to stand transfers.  Baseline: see above Goal status: Met  3.  Patient will complete Rt SLR without quad lag indicative of improved knee stability.  Baseline: visible shaking with quad set  Goal status: Met     LONG TERM GOALS: Target date: 07/21/23  Patient will demonstrate proper squat mechanics without knee pain.  Baseline: avoids squatting currently Goal status: INITIAL  2.  Patient will be able to ascend/descend stairs with reciprocal pattern.  Baseline: see subjective Goal status: INITIAL  3.  Patient will return to previous exercise regimen without limitations as it relates to her knee.  Baseline: currently walking 1/4 mile; previously walking 2 miles daily  Goal status: INITIAL  4.  Patient will report pain at worst rated as </= 2/10 to improve sleep quality.  Baseline: see above Goal status: INITIAL  5.  Patient will be independent with advanced home program to progress/maintain current level of function.  Baseline:  Goal status: INITIAL   PLAN:  PT FREQUENCY: 2x/week  PT DURATION: 6 weeks  PLANNED INTERVENTIONS: 97164- PT Re-evaluation, 97110-Therapeutic exercises, 97530- Therapeutic activity, [id number]- Neuromuscular re-education, 97535- Self Care, [id number]-  Manual therapy, [id number]- Gait training, [id number]- Aquatic Therapy, 97014- Electrical stimulation (unattended), [id number]- Electrical stimulation (manual), 97016- Vasopneumatic device, Taping, Dry Needling, Cryotherapy, and Moist heat  PLAN FOR NEXT SESSION: progress HEP  Knee flexion ROM. Quad strengthening, glute strengthening; game ready.   Reggy Eye, PT,DPT10/31/249:19 AM

## 2023-06-18 ENCOUNTER — Ambulatory Visit: Payer: Managed Care, Other (non HMO) | Attending: Orthopedic Surgery

## 2023-06-18 DIAGNOSIS — M25661 Stiffness of right knee, not elsewhere classified: Secondary | ICD-10-CM

## 2023-06-18 DIAGNOSIS — M6281 Muscle weakness (generalized): Secondary | ICD-10-CM | POA: Diagnosis present

## 2023-06-18 DIAGNOSIS — R600 Localized edema: Secondary | ICD-10-CM | POA: Diagnosis present

## 2023-06-18 DIAGNOSIS — M25561 Pain in right knee: Secondary | ICD-10-CM | POA: Diagnosis present

## 2023-06-18 NOTE — Therapy (Signed)
OUTPATIENT PHYSICAL THERAPY LOWER EXTREMITY TREATMENT   Patient Name: Nicole Petersen MRN: [id number] DOB:17-Sep-1965, 57 y.o., female Today's Date: 06/18/2023  END OF SESSION:  PT End of Session - 06/18/23 0850     Visit Number 4    Number of Visits 13    Date for PT Re-Evaluation 07/21/23    Authorization Type [employer]    PT Start Time 0848    PT Stop Time 0940    PT Time Calculation (min) 52 min    Activity Tolerance Patient tolerated treatment well    Behavior During Therapy WFL for tasks assessed/performed                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   PCP: Stamey, Verda Cumins, FNP  REFERRING PROVIDER: Yolonda Kida, MD   REFERRING DIAG: [id number] (ICD-10-CM) - Encounter for other orthopedic aftercare   THERAPY DIAG:  Acute pain of right knee  Muscle weakness (generalized)  Localized edema  Stiffness of right knee, not elsewhere classified  Rationale for Evaluation and Treatment: Rehabilitation  ONSET DATE: 06/01/23  SUBJECTIVE:   SUBJECTIVE STATEMENT: No pain right now, but had pain in both knees at night and felt it in the knee caps.   PERTINENT HISTORY: Rt knee partial medial meniscectomy 06/01/23 Rheumatoid arthritis  Self reports Rt calf strain >10 years ago PAIN:  Are you having pain? No   PRECAUTIONS: Knee   WEIGHT BEARING  RESTRICTIONS: Yes WBAT RLE  FALLS:  Has patient fallen in last 6 months? No   PATIENT GOALS: "get rid of the stiffness."  NEXT MD VISIT: 06/19/23  OBJECTIVE:  Note: Objective measures were completed at Evaluation unless otherwise noted.  DIAGNOSTIC FINDINGS: none on file   PATIENT SURVEYS:  FOTO 52% function to 77% predicted  COGNITION: Overall cognitive status: Within functional limits for tasks assessed     SENSATION: Not tested  EDEMA:  Mild non-pitting edema about Rt knee     POSTURE:  genu varum  PALPATION: Diffuse tenderness about Rt knee   LOWER EXTREMITY ROM:  Active ROM Right eval Left eval 06/11/23 Right  Hip flexion     Hip extension     Hip abduction     Hip adduction     Hip internal rotation     Hip external rotation     Knee flexion 108 135 125  Knee extension 0    Ankle dorsiflexion     Ankle plantarflexion     Ankle inversion     Ankle eversion      (Blank rows = not tested)  LOWER EXTREMITY MMT:  MMT Right eval Left eval  Hip flexion 4+ 5  Hip extension    Hip abduction 4 4  Hip adduction    Hip internal rotation    Hip external rotation  Knee flexion 4 pn 5  Knee extension 4 5  Ankle dorsiflexion    Ankle plantarflexion    Ankle inversion    Ankle eversion     (Blank rows = not tested)  LOWER EXTREMITY SPECIAL TESTS:  N/A  FUNCTIONAL TESTS:  N/A  GAIT: Distance walked: 10 ft  Assistive device utilized: None Level of assistance: Complete Independence Comments: no obvious gait abnormalities  [facility] Adult PT Treatment:                                                DATE: 06/18/23 Therapeutic Exercise: Bike level 1 x 5 minutes  Calf stretch on wedge x 1 minute  Hamstring stretch with strap x 30 sec each Prone quad stretch x 30 sec each   Squat with UE support 2 x 10  Step up knee driver 2 x 10; 6 inch step  Hip bridge with abduction blue band 2 x 10  Updated HEP  Modalities: Game ready to Rt knee x 10  minutes; 34 degrees, medium compression    [facility] Adult PT Treatment:                                                DATE: 06/14/23 Therapeutic Exercise: Recumbent L1 x 5 min Hip bridge 3 x 10 SLR red TB 3 x 10 Sidelying hip abd 3 x 10 Tandem stance on foam 2 x 30 sec - intermittent UE support Rocker board static x 1 min each A/P and laterally - intermittent UE support Leg press 3 x 10 @ 45 lbs  Seated LAQ and HS curl red TB 3 x 10  Modalities: Game ready to Rt knee x 10 minutes; 34 degrees, low compression    [facility] Adult PT Treatment:                                                DATE: 06/11/23 Therapeutic Exercise: Recumbent bike no resistance fwd revolutions x 3 minutes  Heel slides x 10; 5 sec hold  SLR 2 x 10  Hip bridge 2 x 10  Sidelying hip abduction 2 x 10  Resisted HS curl red band 2 x 10  TKE with ball at wall 2 x 10  LAQ 2 x 10 @ 1 lb  Leg press 2 x 10 @ 45 lbs  HS stretch with strap 2 x 30 sec  Updated HEP   Modalities: Game ready to Rt knee x 10 minutes; 34 degrees, low compression  PATIENT EDUCATION:  Education details: HEP update Person educated: Patient Education method: Explanation, Demonstration, Tactile cues, Verbal cues, and Handouts Education comprehension: verbalized understanding, returned demonstration, verbal cues required, tactile cues required, and needs further education  HOME EXERCISE PROGRAM: Access Code: [email or web address] URL: [email or web address] Date: 06/18/2023 Prepared by: Letitia Libra  Exercises - Supine Hamstring Stretch with Strap  - 2 x daily - 7 x weekly - 3 sets - 20-30 sec  hold - Gastroc Stretch on Wall  - 1 x daily - 7 x weekly - 3 sets - 30 sec  hold - Prone Quadriceps  Stretch with Strap  - 1 x daily - 7 x weekly - 3 sets - 30 sec  hold - Supine Heel Slide  - 2 x daily - 7 x weekly - 2 sets - 10 reps - 5 sec  hold - Seated Knee Flexion AAROM  - 2 x daily - 7 x weekly - 2 sets - 10 reps - 5 sec  hold - Seated Long  Arc Quad  - 2 x daily - 7 x weekly - 2 sets - 10 reps - Heel Raises with Counter Support  - 1 x daily - 7 x weekly - 2 sets - 10 reps - Supine Active Straight Leg Raise  - 1 x daily - 7 x weekly - 2 sets - 10 reps - Supine Bridge  - 1 x daily - 7 x weekly - 2 sets - 10 reps  ASSESSMENT:  CLINICAL IMPRESSION: Patient arrives without reports of pain, though does describe patellofemoral pain occasionally in bilateral knees. Added additional stretching to her HEP with recommendation to perform prior to her daily walking. Focused on CKC strengthening with good tolerance. With squats she initially demonstrates excessive anterior tibial translation and reports bilateral anterior knee pain. With cues she is able to correct, which abolishes her pain. Overall good stability with SL tasks, but does require occasional use of UE support with SL activity on the RLE.   OBJECTIVE IMPAIRMENTS: decreased activity tolerance, decreased endurance, decreased knowledge of condition, decreased mobility, difficulty walking, decreased ROM, decreased strength, increased edema, impaired flexibility, improper body mechanics, postural dysfunction, and pain.     GOALS: Goals reviewed with patient? Yes  SHORT TERM GOALS: Target date: 06/28/2023   Patient will be independent and compliant with initial HEP.   Baseline: issued at eval  Goal status: INITIAL  2.  Patient will demonstrate at least 120 degrees of Rt knee flexion AROM to improve ability to complete sit to stand transfers.  Baseline: see above Goal status: Met  3.  Patient will complete Rt SLR without quad lag indicative of improved knee stability.  Baseline: visible shaking with quad set  Goal status: Met     LONG TERM GOALS: Target date: 07/21/23  Patient will demonstrate proper squat mechanics without knee pain.  Baseline: avoids squatting currently Goal status: INITIAL  2.  Patient will be able to ascend/descend stairs with reciprocal pattern.   Baseline: see subjective Goal status: INITIAL  3.  Patient will return to previous exercise regimen without limitations as it relates to her knee.  Baseline: currently walking 1/4 mile; previously walking 2 miles daily  Goal status: INITIAL  4.  Patient will report pain at worst rated as </= 2/10 to improve sleep quality.  Baseline: see above Goal status: INITIAL  5.  Patient will be independent with advanced home program to progress/maintain current level of function.  Baseline:  Goal status: INITIAL   PLAN:  PT FREQUENCY: 2x/week  PT DURATION: 6 weeks  PLANNED INTERVENTIONS: 97164- PT Re-evaluation, 97110-Therapeutic exercises, 97530- Therapeutic activity, [id number]- Neuromuscular re-education, 97535- Self Care, [id number]- Manual therapy, [id number]- Gait training, [id number]- Aquatic Therapy, 97014- Electrical stimulation (unattended), [id number]- Electrical stimulation (manual), 97016- Vasopneumatic device, Taping, Dry Needling, Cryotherapy, and Moist heat  PLAN FOR NEXT SESSION: progress HEP  Knee flexion ROM. Quad strengthening, glute strengthening; game ready.   Letitia Libra, PT, DPT, ATC 06/18/23 9:44 AM

## 2023-06-21 ENCOUNTER — Ambulatory Visit: Payer: Managed Care, Other (non HMO)

## 2023-06-21 DIAGNOSIS — M25561 Pain in right knee: Secondary | ICD-10-CM

## 2023-06-21 DIAGNOSIS — M25661 Stiffness of right knee, not elsewhere classified: Secondary | ICD-10-CM

## 2023-06-21 DIAGNOSIS — M6281 Muscle weakness (generalized): Secondary | ICD-10-CM

## 2023-06-21 DIAGNOSIS — R600 Localized edema: Secondary | ICD-10-CM

## 2023-06-21 NOTE — Therapy (Signed)
OUTPATIENT PHYSICAL THERAPY LOWER EXTREMITY TREATMENT   Patient Name: Nicole Petersen MRN: [id number] DOB:03-07-1966, 57 y.o., female Today's Date: 06/21/2023  END OF SESSION:  PT End of Session - 06/21/23 0848     Visit Number 5    Number of Visits 13    Date for PT Re-Evaluation 07/21/23    Authorization Type [employer]    PT Start Time 0848    PT Stop Time 0938    PT Time Calculation (min) 50 min    Activity Tolerance Patient tolerated treatment well    Behavior During Therapy WFL for tasks assessed/performed                 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   PCP: Stamey, Verda Cumins, FNP  REFERRING PROVIDER: Yolonda Kida, MD   REFERRING DIAG: [phone] (ICD-10-CM) - Encounter for other orthopedic aftercare   THERAPY DIAG:  Acute pain of right knee  Muscle weakness (generalized)  Localized edema  Stiffness of right knee, not elsewhere classified  Rationale for Evaluation and Treatment: Rehabilitation  ONSET DATE: 06/01/23  SUBJECTIVE:   SUBJECTIVE STATEMENT: She saw the PA and was prescribed an antibiotic due to concerns of infection at surgical site. Knee feels good right now without pain.   PERTINENT HISTORY: Rt knee partial medial meniscectomy 06/01/23 Rheumatoid arthritis  Self reports Rt calf strain >10 years ago PAIN:  Are you having  pain? No   PRECAUTIONS: Knee   WEIGHT BEARING RESTRICTIONS: Yes WBAT RLE  FALLS:  Has patient fallen in last 6 months? No   PATIENT GOALS: "get rid of the stiffness."  NEXT MD VISIT: 06/19/23  OBJECTIVE:  Note: Objective measures were completed at Evaluation unless otherwise noted.  DIAGNOSTIC FINDINGS: none on file   PATIENT SURVEYS:  FOTO 52% function to 77% predicted  COGNITION: Overall cognitive status: Within functional limits for tasks assessed     SENSATION: Not tested  EDEMA:  Mild non-pitting edema about Rt knee     POSTURE:  genu varum  PALPATION: Diffuse tenderness about Rt knee   LOWER EXTREMITY ROM:  Active ROM Right eval Left eval 06/11/23 Right 06/21/23 Right   Hip flexion      Hip extension      Hip abduction      Hip adduction      Hip internal rotation      Hip external rotation      Knee flexion 108 135 125 135  Knee extension 0     Ankle dorsiflexion      Ankle plantarflexion      Ankle inversion      Ankle eversion       (Blank rows = not tested)  LOWER EXTREMITY MMT:  MMT Right eval Left eval  Hip flexion 4+ 5  Hip extension  Hip abduction 4 4  Hip adduction    Hip internal rotation    Hip external rotation    Knee flexion 4 pn 5  Knee extension 4 5  Ankle dorsiflexion    Ankle plantarflexion    Ankle inversion    Ankle eversion     (Blank rows = not tested)  LOWER EXTREMITY SPECIAL TESTS:  N/A  FUNCTIONAL TESTS:  N/A  GAIT: Distance walked: 10 ft  Assistive device utilized: None Level of assistance: Complete Independence Comments: no obvious gait abnormalities  [facility] Adult PT Treatment:                                                DATE: 06/21/23 Therapeutic Exercise: Recumbent bike level 2 x 5 minutes Hip bridge with knee extension 2 x 10  Calf stretch on wedge x 1 minute  Leg press 2 x 10 @ 40 lbs  Lateral step ups 6 inch 2 x 10  Squat with UE support 2 x 10  Standing hip abduction green band 2 x  10   Modalities: Game ready to Rt knee x 10 minutes; 34 degrees, medium compression     [facility] Adult PT Treatment:                                                DATE: 06/18/23 Therapeutic Exercise: Bike level 1 x 5 minutes  Calf stretch on wedge x 1 minute  Hamstring stretch with strap x 30 sec each Prone quad stretch x 30 sec each   Squat with UE support 2 x 10  Step up knee driver 2 x 10; 6 inch step  Hip bridge with abduction blue band 2 x 10  Updated HEP  Modalities: Game ready to Rt knee x 10 minutes; 34 degrees, medium compression    [facility] Adult PT Treatment:                                                DATE: 06/14/23 Therapeutic Exercise: Recumbent L1 x 5 min Hip bridge 3 x 10 SLR red TB 3 x 10 Sidelying hip abd 3 x 10 Tandem stance on foam 2 x 30 sec - intermittent UE support Rocker board static x 1 min each A/P and laterally - intermittent UE support Leg press 3 x 10 @ 45 lbs  Seated LAQ and HS curl red TB 3 x 10  Modalities: Game ready to Rt knee x 10 minutes; 34 degrees, low compression   PATIENT EDUCATION:  Education details: HEP update Person educated: Patient Education method: Explanation, Demonstration, Tactile cues, Verbal cues, and Handouts Education comprehension: verbalized understanding, returned demonstration, verbal cues required, tactile cues required, and needs further education  HOME EXERCISE PROGRAM: Access Code: [email or web address] URL: [email or web address] Date: 06/21/2023 Prepared by: Letitia Libra  Exercises - Supine Hamstring Stretch with Strap  - 2 x daily - 7 x weekly - 3 sets - 20-30 sec  hold - Gastroc Stretch on Wall  - 1 x daily - 7 x weekly - 3 sets - 30 sec  hold - Prone Quadriceps  Stretch with Strap  - 1 x daily - 7 x weekly - 3 sets - 30 sec  hold - Seated Long Arc Quad  - 2 x daily - 7 x weekly - 2 sets - 10 reps - Heel Raises with Counter Support  - 1 x daily - 7 x weekly - 2 sets - 10 reps - Supine Active Straight  Leg Raise  - 1 x daily - 7 x weekly - 2 sets - 10 reps - Supine Bridge  - 1 x daily - 7 x weekly - 2 sets - 10 reps - Mini Squat with Counter Support  - 1 x daily - 7 x weekly - 2 sets - 10 reps - Step Up  - 1 x daily - 7 x weekly - 2 sets - 10 reps - Lateral Step Up  - 1 x daily - 7 x weekly - 2 sets - 10 reps  ASSESSMENT:  CLINICAL IMPRESSION: Continued with progression of hip and knee strengthening today with good tolerance. She demonstrates full and pain free Rt knee flexion AROM. Progressed CKC strengthening with patient demonstrating good form and control with leg press as well as squats. No reports of knee pain throughout session.   OBJECTIVE IMPAIRMENTS: decreased activity tolerance, decreased endurance, decreased knowledge of condition, decreased mobility, difficulty walking, decreased ROM, decreased strength, increased edema, impaired flexibility, improper body mechanics, postural dysfunction, and pain.     GOALS: Goals reviewed with patient? Yes  SHORT TERM GOALS: Target date: 06/28/2023   Patient will be independent and compliant with initial HEP.   Baseline: issued at eval  Goal status: Met   2.  Patient will demonstrate at least 120 degrees of Rt knee flexion AROM to improve ability to complete sit to stand transfers.  Baseline: see above Goal status: Met  3.  Patient will complete Rt SLR without quad lag indicative of improved knee stability.  Baseline: visible shaking with quad set  Goal status: Met     LONG TERM GOALS: Target date: 07/21/23  Patient will demonstrate proper squat mechanics without knee pain.  Baseline: avoids squatting currently Goal status: INITIAL  2.  Patient will be able to ascend/descend stairs with reciprocal pattern.  Baseline: see subjective Goal status: INITIAL  3.  Patient will return to previous exercise regimen without limitations as it relates to her knee.  Baseline: currently walking 1/4 mile; previously walking 2 miles  daily  Goal status: INITIAL  4.  Patient will report pain at worst rated as </= 2/10 to improve sleep quality.  Baseline: see above Goal status: INITIAL  5.  Patient will be independent with advanced home program to progress/maintain current level of function.  Baseline:  Goal status: INITIAL   PLAN:  PT FREQUENCY: 2x/week  PT DURATION: 6 weeks  PLANNED INTERVENTIONS: 97164- PT Re-evaluation, 97110-Therapeutic exercises, 97530- Therapeutic activity, 97112- Neuromuscular re-education, 97535- Self Care, [id number]- Manual therapy, [id number]- Gait training, [id number]- Aquatic Therapy, 97014- Electrical stimulation (unattended), [id number]- Electrical stimulation (manual), 97016- Vasopneumatic device, Taping, Dry Needling, Cryotherapy, and Moist heat  PLAN FOR NEXT SESSION: progress HEP prn; Quad strengthening, glute strengthening; CKC strengthening; game ready.   Letitia Libra, PT, DPT, ATC 06/21/23 9:32 AM

## 2023-06-25 ENCOUNTER — Ambulatory Visit: Payer: Managed Care, Other (non HMO)

## 2023-06-25 DIAGNOSIS — M6281 Muscle weakness (generalized): Secondary | ICD-10-CM

## 2023-06-25 DIAGNOSIS — M25661 Stiffness of right knee, not elsewhere classified: Secondary | ICD-10-CM

## 2023-06-25 DIAGNOSIS — M25561 Pain in right knee: Secondary | ICD-10-CM

## 2023-06-25 DIAGNOSIS — R600 Localized edema: Secondary | ICD-10-CM

## 2023-06-25 NOTE — Therapy (Signed)
OUTPATIENT PHYSICAL THERAPY LOWER EXTREMITY TREATMENT   Patient Name: Nicole Petersen MRN: [id number] DOB:07-Nov-1965, 57 y.o., female Today's Date: 06/25/2023  END OF SESSION:  PT End of Session - 06/25/23 0844     Visit Number 6    Number of Visits 13    Date for PT Re-Evaluation 07/21/23    Authorization Type [employer]    PT Start Time [phone]    PT Stop Time 0935    PT Time Calculation (min) 48 min    Activity Tolerance Patient tolerated treatment well    Behavior During Therapy WFL for tasks assessed/performed                  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   PCP: Stamey, Verda Cumins, FNP  REFERRING PROVIDER: Yolonda Kida, MD   REFERRING DIAG: [phone] (ICD-10-CM) - Encounter for other orthopedic aftercare   THERAPY DIAG:  Acute pain of right knee  Muscle weakness (generalized)  Localized edema  Stiffness of right knee, not elsewhere classified  Rationale for Evaluation and Treatment: Rehabilitation  ONSET DATE: 06/01/23  SUBJECTIVE:   SUBJECTIVE STATEMENT: "A little stiff. Could be the weather. Could be my RA."   PERTINENT HISTORY: Rt knee partial medial meniscectomy 06/01/23 Rheumatoid arthritis  Self reports Rt calf strain >10 years ago PAIN:  Are you having pain? No   PRECAUTIONS: Knee   WEIGHT BEARING RESTRICTIONS: Yes WBAT  RLE  FALLS:  Has patient fallen in last 6 months? No   PATIENT GOALS: "get rid of the stiffness."  NEXT MD VISIT: 06/19/23  OBJECTIVE:  Note: Objective measures were completed at Evaluation unless otherwise noted.  DIAGNOSTIC FINDINGS: none on file   PATIENT SURVEYS:  FOTO 52% function to 77% predicted  06/25/23: 69% function  COGNITION: Overall cognitive status: Within functional limits for tasks assessed     SENSATION: Not tested  EDEMA:  Mild non-pitting edema about Rt knee     POSTURE:  genu varum  PALPATION: Diffuse tenderness about Rt knee   LOWER EXTREMITY ROM:  Active ROM Right eval Left eval 06/11/23 Right 06/21/23 Right   Hip flexion      Hip extension      Hip abduction      Hip adduction      Hip internal rotation      Hip external rotation      Knee flexion 108 135 125 135  Knee extension 0     Ankle dorsiflexion      Ankle plantarflexion      Ankle inversion      Ankle eversion       (Blank rows = not tested)  LOWER EXTREMITY MMT:  MMT Right eval Left eval  Hip flexion 4+ 5  Hip extension    Hip abduction 4 4  Hip adduction  Hip internal rotation    Hip external rotation    Knee flexion 4 pn 5  Knee extension 4 5  Ankle dorsiflexion    Ankle plantarflexion    Ankle inversion    Ankle eversion     (Blank rows = not tested)  LOWER EXTREMITY SPECIAL TESTS:  N/A  FUNCTIONAL TESTS:  N/A  GAIT: Distance walked: 10 ft  Assistive device utilized: None Level of assistance: Complete Independence Comments: no obvious gait abnormalities  [facility] Adult PT Treatment:                                                DATE: 06/25/23 Therapeutic Exercise: Recumbent level 2 x 5 minutes  HS stretch with strap x 1 minute each  Eccentric leg press 2 x 10 each @ 40 lbs  Squat to table 3 x 10  Manual Therapy: IASTM Rt distal quadriceps, adductors  Modalities: Game ready to Rt knee x 10 minutes; 34 degrees, medium compression     [facility]  Adult PT Treatment:                                                DATE: 06/21/23 Therapeutic Exercise: Recumbent bike level 2 x 5 minutes Hip bridge with knee extension 2 x 10  Calf stretch on wedge x 1 minute  Leg press 2 x 10 @ 40 lbs  Lateral step ups 6 inch 2 x 10  Squat with UE support 2 x 10  Standing hip abduction green band 2 x 10   Modalities: Game ready to Rt knee x 10 minutes; 34 degrees, medium compression     [facility] Adult PT Treatment:                                                DATE: 06/18/23 Therapeutic Exercise: Bike level 1 x 5 minutes  Calf stretch on wedge x 1 minute  Hamstring stretch with strap x 30 sec each Prone quad stretch x 30 sec each   Squat with UE support 2 x 10  Step up knee driver 2 x 10; 6 inch step  Hip bridge with abduction blue band 2 x 10  Updated HEP  Modalities: Game ready to Rt knee x 10 minutes; 34 degrees, medium compression    PATIENT EDUCATION:  Education details: HEP review Person educated: Patient Education method: Explanation Education comprehension: verbalized understanding  HOME EXERCISE PROGRAM: Access Code: [email or web address] URL: [email or web address] Date: 06/21/2023 Prepared by: Letitia Libra  Exercises - Supine Hamstring Stretch with Strap  - 2 x daily - 7 x weekly - 3 sets - 20-30 sec  hold - Gastroc Stretch on Wall  - 1 x daily - 7 x weekly - 3 sets - 30 sec  hold - Prone Quadriceps Stretch with Strap  - 1 x daily - 7 x weekly - 3 sets - 30 sec  hold - Seated Long Arc Quad  - 2 x daily - 7 x weekly - 2 sets - 10 reps - Heel Raises with Counter Support  - 1 x daily - 7  x weekly - 2 sets - 10 reps - Supine Active Straight Leg Raise  - 1 x daily - 7 x weekly - 2 sets - 10 reps - Supine Bridge  - 1 x daily - 7 x weekly - 2 sets - 10 reps - Mini Squat with Counter Support  - 1 x daily - 7 x weekly - 2 sets - 10 reps - Step Up  - 1 x daily - 7 x weekly - 2 sets - 10 reps - Lateral Step Up  - 1 x daily - 7 x  weekly - 2 sets - 10 reps  ASSESSMENT:  CLINICAL IMPRESSION: Focused on CKC strengthening with good tolerance. FOTO score has improved compared to initial evaluation, nearing the predicted outcome. She demonstrates proper squat form through partial range. She reported mild dull pain about Rt medial knee with squatting that was relieved following IASTM to quadriceps and adductors.   OBJECTIVE IMPAIRMENTS: decreased activity tolerance, decreased endurance, decreased knowledge of condition, decreased mobility, difficulty walking, decreased ROM, decreased strength, increased edema, impaired flexibility, improper body mechanics, postural dysfunction, and pain.     GOALS: Goals reviewed with patient? Yes  SHORT TERM GOALS: Target date: 06/28/2023   Patient will be independent and compliant with initial HEP.   Baseline: issued at eval  Goal status: Met   2.  Patient will demonstrate at least 120 degrees of Rt knee flexion AROM to improve ability to complete sit to stand transfers.  Baseline: see above Goal status: Met  3.  Patient will complete Rt SLR without quad lag indicative of improved knee stability.  Baseline: visible shaking with quad set  Goal status: Met     LONG TERM GOALS: Target date: 07/21/23  Patient will demonstrate proper squat mechanics without knee pain.  Baseline: avoids squatting currently Goal status: INITIAL  2.  Patient will be able to ascend/descend stairs with reciprocal pattern.  Baseline: see subjective Goal status: INITIAL  3.  Patient will return to previous exercise regimen without limitations as it relates to her knee.  Baseline: currently walking 1/4 mile; previously walking 2 miles daily  Goal status: INITIAL  4.  Patient will report pain at worst rated as </= 2/10 to improve sleep quality.  Baseline: see above Goal status: INITIAL  5.  Patient will be independent with advanced home program to progress/maintain current level of function.   Baseline:  Goal status: INITIAL   PLAN:  PT FREQUENCY: 2x/week  PT DURATION: 6 weeks  PLANNED INTERVENTIONS: 97164- PT Re-evaluation, 97110-Therapeutic exercises, 97530- Therapeutic activity, 97112- Neuromuscular re-education, 97535- Self Care, [id number]- Manual therapy, [id number]- Gait training, [id number]- Aquatic Therapy, 97014- Electrical stimulation (unattended), [id number]- Electrical stimulation (manual), 97016- Vasopneumatic device, Taping, Dry Needling, Cryotherapy, and Moist heat  PLAN FOR NEXT SESSION: progress HEP prn; Quad strengthening, glute strengthening; CKC strengthening; game ready.   Letitia Libra, PT, DPT, ATC 06/25/23 10:05 AM

## 2023-06-28 ENCOUNTER — Ambulatory Visit: Payer: Managed Care, Other (non HMO)

## 2023-06-28 DIAGNOSIS — M25661 Stiffness of right knee, not elsewhere classified: Secondary | ICD-10-CM

## 2023-06-28 DIAGNOSIS — M25561 Pain in right knee: Secondary | ICD-10-CM | POA: Diagnosis not present

## 2023-06-28 DIAGNOSIS — R600 Localized edema: Secondary | ICD-10-CM

## 2023-06-28 DIAGNOSIS — M6281 Muscle weakness (generalized): Secondary | ICD-10-CM

## 2023-06-28 NOTE — Therapy (Signed)
OUTPATIENT PHYSICAL THERAPY LOWER EXTREMITY TREATMENT   Patient Name: Nicole Petersen MRN: [id number] DOB:October 26, 1965, 57 y.o., female Today's Date: 06/28/2023  END OF SESSION:  PT End of Session - 06/28/23 0844     Visit Number 7    Number of Visits 13    Date for PT Re-Evaluation 07/21/23    Authorization Type [employer]    PT Start Time [phone]    PT Stop Time 0935    PT Time Calculation (min) 51 min    Activity Tolerance Patient tolerated treatment well    Behavior During Therapy WFL for tasks assessed/performed                   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   PCP: Stamey, Verda Cumins, FNP  REFERRING PROVIDER: Yolonda Kida, MD   REFERRING DIAG: [phone] (ICD-10-CM) - Encounter for other orthopedic aftercare   THERAPY DIAG:  Acute pain of right knee  Muscle weakness (generalized)  Localized edema  Stiffness of right knee, not elsewhere classified  Rationale for Evaluation and Treatment: Rehabilitation  ONSET DATE: 06/01/23  SUBJECTIVE:   SUBJECTIVE STATEMENT: "It's tight."   PERTINENT HISTORY: Rt knee partial medial meniscectomy 06/01/23 Rheumatoid arthritis  Self reports Rt calf strain >10 years ago PAIN:  Are you having pain? Yes: NPRS scale: 5/10 Pain location: Rt hamstring and gastroc  Pain description: tight Aggravating factors:  unknown Relieving factors: movement    PRECAUTIONS: Knee   WEIGHT BEARING RESTRICTIONS: Yes WBAT RLE  FALLS:  Has patient fallen in last 6 months? No   PATIENT GOALS: "get rid of the stiffness."  NEXT MD VISIT: 06/19/23  OBJECTIVE:  Note: Objective measures were completed at Evaluation unless otherwise noted.  DIAGNOSTIC FINDINGS: none on file   PATIENT SURVEYS:  FOTO 52% function to 77% predicted  06/25/23: 69% function  COGNITION: Overall cognitive status: Within functional limits for tasks assessed     SENSATION: Not tested  EDEMA:  Mild non-pitting edema about Rt knee     POSTURE:  genu varum  PALPATION: Diffuse tenderness about Rt knee   LOWER EXTREMITY ROM:  Active ROM Right eval Left eval 06/11/23 Right 06/21/23 Right   Hip flexion      Hip extension      Hip abduction      Hip adduction      Hip internal rotation      Hip external rotation      Knee flexion 108 135 125 135  Knee extension 0     Ankle dorsiflexion      Ankle plantarflexion      Ankle inversion      Ankle eversion       (Blank rows = not tested)  LOWER EXTREMITY MMT:  MMT Right eval Left eval  Hip flexion 4+ 5  Hip extension    Hip abduction 4 4  Hip adduction    Hip internal rotation    Hip external rotation    Knee flexion 4 pn 5  Knee extension 4 5  Ankle dorsiflexion    Ankle plantarflexion    Ankle inversion    Ankle eversion     (Blank rows = not tested)  LOWER EXTREMITY SPECIAL TESTS:  N/A  FUNCTIONAL TESTS:  N/A  GAIT: Distance walked: 10 ft  Assistive device utilized: None Level of assistance: Complete Independence Comments: no obvious gait abnormalities  [facility] Adult PT Treatment:                                                DATE: 06/28/23 Therapeutic Exercise: Recumbent bike level 2 x 5 minutes  HS stretch with strap x 1 minute Calf stretch on wedge x 1 minute Step ups 8 inch step x 10; x 10 knee driver  Lateral step down 4 inch 2 x 10   Lateral step ups 8 inch step 2 x 10  SLR with square trace 2 x 10  Manual Therapy: IASTM gastroc/soleus, hamstrings, IT band RLE   Modalities: Game ready to Rt knee x 10 minutes; 34 degrees, medium compression     [facility] Adult PT Treatment:                                                DATE: 06/25/23 Therapeutic Exercise: Recumbent level 2 x 5 minutes  HS stretch with strap x 1 minute each  Eccentric leg press 2 x 10 each @ 40 lbs  Squat to table 3 x 10  Manual Therapy: IASTM Rt distal quadriceps, adductors  Modalities: Game ready to Rt knee x 10 minutes; 34 degrees, medium compression     [facility] Adult PT Treatment:                                                DATE: 06/21/23 Therapeutic Exercise: Recumbent bike level 2 x 5 minutes Hip bridge with knee extension 2 x 10  Calf stretch on wedge x 1 minute  Leg press 2 x 10 @ 40 lbs  Lateral step ups 6 inch 2 x 10  Squat with UE support 2 x 10  Standing hip abduction green band 2 x 10   Modalities: Game ready to Rt knee x 10 minutes; 34 degrees, medium compression     PATIENT EDUCATION:  Education details: HEP review Person educated: Patient Education method: Explanation Education comprehension: verbalized understanding  HOME EXERCISE PROGRAM: Access Code: [email or web address] URL: [email or web address] Date: 06/21/2023 Prepared by: Letitia Libra  Exercises - Supine Hamstring Stretch with Strap  - 2 x daily - 7 x weekly - 3 sets - 20-30 sec  hold - Gastroc Stretch on Wall  - 1 x daily - 7 x weekly - 3 sets - 30 sec  hold - Prone Quadriceps Stretch with Strap  - 1 x daily - 7 x weekly - 3 sets - 30 sec  hold - Seated Long Arc Quad  - 2 x daily -  7 x weekly - 2 sets - 10 reps - Heel Raises with Counter Support  - 1 x daily - 7 x weekly - 2 sets - 10 reps - Supine Active Straight Leg Raise  - 1 x daily - 7 x weekly - 2 sets - 10 reps - Supine Bridge  - 1 x daily - 7 x weekly - 2 sets - 10 reps - Mini Squat with  Counter Support  - 1 x daily - 7 x weekly - 2 sets - 10 reps - Step Up  - 1 x daily - 7 x weekly - 2 sets - 10 reps - Lateral Step Up  - 1 x daily - 7 x weekly - 2 sets - 10 reps  ASSESSMENT:  CLINICAL IMPRESSION: Patient with complaints of tightness in the hamstring and gastroc upon arrival that is improved with IASTM to musculature. No palpable tenderness about posterior RLE or swelling,warmth noted, but patient was educated on signs and symptoms of DVT and urged to seek urgent care if noted with patient verbalizing understanding. Continued with CKC strengthening incorporating more SL activity with good tolerance. Challenged with lateral step downs having initial difficulty controlling lowering on the RLE. One instance of Rt medial knee with lateral step down, but with continued reps pain subsided. Otherwise no complaints of pain throughout session.   OBJECTIVE IMPAIRMENTS: decreased activity tolerance, decreased endurance, decreased knowledge of condition, decreased mobility, difficulty walking, decreased ROM, decreased strength, increased edema, impaired flexibility, improper body mechanics, postural dysfunction, and pain.     GOALS: Goals reviewed with patient? Yes  SHORT TERM GOALS: Target date: 06/28/2023   Patient will be independent and compliant with initial HEP.   Baseline: issued at eval  Goal status: Met   2.  Patient will demonstrate at least 120 degrees of Rt knee flexion AROM to improve ability to complete sit to stand transfers.  Baseline: see above Goal status: Met  3.  Patient will complete Rt SLR without quad lag indicative of improved knee stability.  Baseline: visible shaking with quad set  Goal status: Met     LONG TERM GOALS: Target date: 07/21/23  Patient will demonstrate proper squat mechanics without knee pain.  Baseline: avoids squatting currently Goal status: INITIAL  2.  Patient will be able to ascend/descend stairs with reciprocal pattern.   Baseline: see subjective Goal status: INITIAL  3.  Patient will return to previous exercise regimen without limitations as it relates to her knee.  Baseline: currently walking 1/4 mile; previously walking 2 miles daily  Goal status: INITIAL  4.  Patient will report pain at worst rated as </= 2/10 to improve sleep quality.  Baseline: see above Goal status: INITIAL  5.  Patient will be independent with advanced home program to progress/maintain current level of function.  Baseline:  Goal status: INITIAL   PLAN:  PT FREQUENCY: 2x/week  PT DURATION: 6 weeks  PLANNED INTERVENTIONS: 97164- PT Re-evaluation, 97110-Therapeutic exercises, 97530- Therapeutic activity, 97112- Neuromuscular re-education, 97535- Self Care, 10272- Manual therapy, [id number]- Gait training, [id number]- Aquatic Therapy, 97014- Electrical stimulation (unattended), [id number]- Electrical stimulation (manual), 97016- Vasopneumatic device, Taping, Dry Needling, Cryotherapy, and Moist heat  PLAN FOR NEXT SESSION: progress HEP prn; Quad strengthening, glute strengthening; CKC strengthening; game ready.   Letitia Libra, PT, DPT, ATC 06/28/23 12:59 PM

## 2023-07-02 ENCOUNTER — Ambulatory Visit: Payer: Managed Care, Other (non HMO)

## 2023-07-02 DIAGNOSIS — M25661 Stiffness of right knee, not elsewhere classified: Secondary | ICD-10-CM

## 2023-07-02 DIAGNOSIS — R600 Localized edema: Secondary | ICD-10-CM

## 2023-07-02 DIAGNOSIS — M25561 Pain in right knee: Secondary | ICD-10-CM | POA: Diagnosis not present

## 2023-07-02 DIAGNOSIS — M6281 Muscle weakness (generalized): Secondary | ICD-10-CM

## 2023-07-02 NOTE — Therapy (Signed)
OUTPATIENT PHYSICAL THERAPY LOWER EXTREMITY TREATMENT   Patient Name: Nicole Petersen MRN: [id number] DOB:12-13-1965, 57 y.o., female Today's Date: 07/02/2023  END OF SESSION:  PT End of Session - 07/02/23 0845     Visit Number 8    Number of Visits 13    Date for PT Re-Evaluation 07/21/23    Authorization Type [employer]    PT Start Time 0845    PT Stop Time 0935    PT Time Calculation (min) 50 min    Activity Tolerance Patient tolerated treatment well    Behavior During Therapy WFL for tasks assessed/performed                    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   PCP: Stamey, Verda Cumins, FNP  REFERRING PROVIDER: Yolonda Kida, MD   REFERRING DIAG: [id number] (ICD-10-CM) - Encounter for other orthopedic aftercare   THERAPY DIAG:  Acute pain of right knee  Muscle weakness (generalized)  Localized edema  Stiffness of right knee, not elsewhere classified  Rationale for Evaluation and Treatment: Rehabilitation  ONSET DATE: 06/01/23  SUBJECTIVE:   SUBJECTIVE STATEMENT: "Feeling good. I walked 3 times yesterday. Haven't done inclines yet."   PERTINENT HISTORY: Rt knee partial medial meniscectomy 06/01/23 Rheumatoid arthritis  Self reports Rt calf strain >10 years ago PAIN:  Are you having pain? No    PRECAUTIONS: Knee   WEIGHT BEARING  RESTRICTIONS: Yes WBAT RLE  FALLS:  Has patient fallen in last 6 months? No   PATIENT GOALS: "get rid of the stiffness."  NEXT MD VISIT: 06/19/23  OBJECTIVE:  Note: Objective measures were completed at Evaluation unless otherwise noted.  DIAGNOSTIC FINDINGS: none on file   PATIENT SURVEYS:  FOTO 52% function to 77% predicted  06/25/23: 69% function  COGNITION: Overall cognitive status: Within functional limits for tasks assessed     SENSATION: Not tested  EDEMA:  Mild non-pitting edema about Rt knee     POSTURE:  genu varum  PALPATION: Diffuse tenderness about Rt knee   LOWER EXTREMITY ROM:  Active ROM Right eval Left eval 06/11/23 Right 06/21/23 Right   Hip flexion      Hip extension      Hip abduction      Hip adduction      Hip internal rotation      Hip external rotation      Knee flexion 108 135 125 135  Knee extension 0     Ankle dorsiflexion      Ankle plantarflexion      Ankle inversion      Ankle eversion       (Blank rows = not tested)  LOWER EXTREMITY MMT:  MMT Right eval Left eval  Hip flexion 4+ 5  Hip extension    Hip abduction 4 4  Hip adduction    Hip internal rotation    Hip external rotation    Knee flexion 4 pn 5  Knee extension 4 5  Ankle dorsiflexion    Ankle plantarflexion    Ankle inversion    Ankle eversion     (Blank rows = not tested)  LOWER EXTREMITY SPECIAL TESTS:  N/A  FUNCTIONAL TESTS:  N/A  GAIT: Distance walked: 10 ft  Assistive device utilized: None Level of assistance: Complete Independence Comments: no obvious gait abnormalities  [facility] Adult PT Treatment:                                                DATE: 07/02/23 Therapeutic Exercise: Treadmill speed 1.5 incline 2 x 5 minutes  Calf stretch on wedge x 1 minute  Resisted backward walking x 10 @ 15 lbs  Forward step downs 2 x 10 @ 4 inch  Leg press 2 x 10 @ 65 lbs  LAQ 2 x 10 @ 5 lbs Bridge with knee extension 2 x 10   Therapeutic  Activity: Inclined walking outdoor surface x 100 ft Modalities: Game ready to Rt knee x 10 minutes; 34 degrees, medium compression     [facility] Adult PT Treatment:                                                DATE: 06/28/23 Therapeutic Exercise: Recumbent bike level 2 x 5 minutes  HS stretch with strap x 1 minute Calf stretch on wedge x 1 minute Step ups 8 inch step x 10; x 10 knee driver  Lateral step down 4 inch 2 x 10  Lateral step ups 8 inch step 2 x 10  SLR with square trace 2 x 10  Manual Therapy: IASTM gastroc/soleus, hamstrings, IT band RLE   Modalities: Game ready to Rt knee x 10 minutes; 34 degrees, medium compression     [facility] Adult PT Treatment:                                                DATE: 06/25/23 Therapeutic Exercise: Recumbent level 2 x 5 minutes  HS stretch with strap x 1 minute each  Eccentric leg press 2 x 10 each @ 40 lbs  Squat to table 3 x 10  Manual Therapy: IASTM Rt distal quadriceps, adductors  Modalities: Game ready to Rt knee x 10 minutes; 34 degrees, medium compression    PATIENT EDUCATION:  Education details: HEP review Person educated: Patient Education method: Explanation Education comprehension: verbalized understanding  HOME EXERCISE PROGRAM: Access Code: [email or web address] URL: [email or web address] Date: 06/21/2023 Prepared by: Letitia Libra  Exercises - Supine Hamstring Stretch with Strap  - 2 x daily - 7 x weekly - 3 sets - 20-30 sec  hold - Gastroc Stretch on Wall  - 1 x daily - 7 x weekly - 3 sets - 30 sec  hold - Prone Quadriceps Stretch with Strap  - 1 x daily - 7 x weekly - 3 sets - 30 sec  hold - Seated Long Arc Quad  - 2 x daily -  7 x weekly - 2 sets - 10 reps - Heel Raises with Counter Support  - 1 x daily - 7 x weekly - 2 sets - 10 reps - Supine Active Straight Leg Raise  - 1 x daily - 7 x weekly - 2 sets - 10 reps - Supine Bridge  - 1 x daily - 7 x weekly - 2 sets - 10 reps - Mini Squat with Counter Support   - 1 x daily - 7 x weekly - 2 sets - 10 reps - Step Up  - 1 x daily - 7 x weekly - 2 sets - 10 reps - Lateral Step Up  - 1 x daily - 7 x weekly - 2 sets - 10 reps  ASSESSMENT:  CLINICAL IMPRESSION: Patient arrives without reports of pain. She has increased her walking frequency daily, but has avoided inclines as she is unsure how this will feel on her knee. She was able to incline treadmill walk and outdoor walk on inclined surfaces with good control and no reports of pain. Was encouraged to incorporate inclined walking as tolerated with patient verbalizing understanding. Focused on eccentric strengthening with patient challenged with step downs, though with continued reps she demonstrates improved form and control. No reports of knee pain throughout session.   OBJECTIVE IMPAIRMENTS: decreased activity tolerance, decreased endurance, decreased knowledge of condition, decreased mobility, difficulty walking, decreased ROM, decreased strength, increased edema, impaired flexibility, improper body mechanics, postural dysfunction, and pain.     GOALS: Goals reviewed with patient? Yes  SHORT TERM GOALS: Target date: 06/28/2023   Patient will be independent and compliant with initial HEP.   Baseline: issued at eval  Goal status: Met   2.  Patient will demonstrate at least 120 degrees of Rt knee flexion AROM to improve ability to complete sit to stand transfers.  Baseline: see above Goal status: Met  3.  Patient will complete Rt SLR without quad lag indicative of improved knee stability.  Baseline: visible shaking with quad set  Goal status: Met     LONG TERM GOALS: Target date: 07/21/23  Patient will demonstrate proper squat mechanics without knee pain.  Baseline: avoids squatting currently Goal status: INITIAL  2.  Patient will be able to ascend/descend stairs with reciprocal pattern.  Baseline: see subjective Goal status: INITIAL  3.  Patient will return to previous exercise  regimen without limitations as it relates to her knee.  Baseline: currently walking 1/4 mile; previously walking 2 miles daily  Goal status: INITIAL  4.  Patient will report pain at worst rated as </= 2/10 to improve sleep quality.  Baseline: see above Goal status: INITIAL  5.  Patient will be independent with advanced home program to progress/maintain current level of function.  Baseline:  Goal status: INITIAL   PLAN:  PT FREQUENCY: 2x/week  PT DURATION: 6 weeks  PLANNED INTERVENTIONS: 97164- PT Re-evaluation, 97110-Therapeutic exercises, 97530- Therapeutic activity, 97112- Neuromuscular re-education, 97535- Self Care, 29562- Manual therapy, [id number]- Gait training, [id number]- Aquatic Therapy, 97014- Electrical stimulation (unattended), [id number]- Electrical stimulation (manual), 97016- Vasopneumatic device, Taping, Dry Needling, Cryotherapy, and Moist heat  PLAN FOR NEXT SESSION: progress HEP prn; Quad strengthening, glute strengthening; CKC strengthening; game ready. Nearing D/C.   Letitia Libra, PT, DPT, ATC 07/02/23 10:12 AM

## 2023-07-06 ENCOUNTER — Ambulatory Visit: Payer: Managed Care, Other (non HMO)

## 2023-07-06 DIAGNOSIS — M25561 Pain in right knee: Secondary | ICD-10-CM | POA: Diagnosis not present

## 2023-07-06 DIAGNOSIS — M25661 Stiffness of right knee, not elsewhere classified: Secondary | ICD-10-CM

## 2023-07-06 DIAGNOSIS — M6281 Muscle weakness (generalized): Secondary | ICD-10-CM

## 2023-07-06 DIAGNOSIS — R600 Localized edema: Secondary | ICD-10-CM

## 2023-07-06 NOTE — Therapy (Signed)
OUTPATIENT PHYSICAL THERAPY LOWER EXTREMITY TREATMENT PHYSICAL THERAPY DISCHARGE SUMMARY  Visits from Start of Care: 9  Current functional level related to goals / functional outcomes: See goals below   Remaining deficits: Intermittent Rt knee pain    Education / Equipment: See education below    Patient agrees to discharge. Patient goals were partially met. Patient is being discharged due to being pleased with the current functional level.   Patient Name: Nicole Petersen MRN: [id number] DOB:05/22/1966, 57 y.o., female Today's Date: 07/06/2023  END OF SESSION:  PT End of Session - 07/06/23 0933     Visit Number 9    Number of Visits 13    Date for PT Re-Evaluation 07/21/23    Authorization Type [employer]    PT Start Time 0932    PT Stop Time 0956    PT Time Calculation (min) 24 min    Activity Tolerance Patient tolerated treatment well    Behavior During Therapy [facility] for tasks assessed/performed                    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   PCP: Stamey, Verda Cumins, FNP  REFERRING PROVIDER: Yolonda Kida, MD   REFERRING DIAG: [phone] (ICD-10-CM) - Encounter for other orthopedic aftercare   THERAPY DIAG:  Acute pain of right knee  Muscle weakness (generalized)  Localized edema  Stiffness of right knee, not elsewhere  classified  Rationale for Evaluation and Treatment: Rehabilitation  ONSET DATE: 06/01/23  SUBJECTIVE:   SUBJECTIVE STATEMENT: Patient reports the knee is feeling good. She has returned to her walking regimen without issues. She feels that she is ready for discharge.   PERTINENT HISTORY: Rt knee partial medial meniscectomy 06/01/23 Rheumatoid arthritis  Self reports Rt calf strain >10 years ago PAIN:  Are you having pain? No    PRECAUTIONS: Knee   WEIGHT BEARING RESTRICTIONS: Yes WBAT RLE  FALLS:  Has patient fallen in last 6 months? No   PATIENT GOALS: "get rid of the stiffness."  NEXT MD VISIT: 06/19/23  OBJECTIVE:  Note: Objective measures were completed at Evaluation unless otherwise noted.  DIAGNOSTIC FINDINGS: none on file   PATIENT SURVEYS:  FOTO 52% function to 77% predicted  06/25/23: 69% function  07/06/23: 82% function  COGNITION: Overall cognitive status: Within functional limits for tasks assessed     SENSATION: Not tested  EDEMA:  Mild non-pitting edema about Rt knee     POSTURE:  genu varum  PALPATION: Diffuse tenderness about Rt knee   LOWER EXTREMITY ROM:  Active ROM Right eval Left eval 06/11/23 Right 06/21/23 Right  07/06/23 Right   Hip flexion       Hip extension       Hip abduction       Hip adduction  Hip internal rotation       Hip external rotation       Knee flexion 108 135 125 135 140  Knee extension 0    0  Ankle dorsiflexion       Ankle plantarflexion       Ankle inversion       Ankle eversion        (Blank rows = not tested)  LOWER EXTREMITY MMT:  MMT Right eval Left eval 07/06/23 Right   Hip flexion 4+ 5 5  Hip extension     Hip abduction 4 4 5   Hip adduction     Hip internal rotation     Hip external rotation     Knee flexion 4 pn 5 5  Knee extension 4 5 5   Ankle dorsiflexion     Ankle plantarflexion     Ankle inversion     Ankle eversion      (Blank rows = not tested)  LOWER EXTREMITY  SPECIAL TESTS:  N/A  FUNCTIONAL TESTS:  N/A  GAIT: Distance walked: 10 ft  Assistive device utilized: None Level of assistance: Complete Independence Comments: no obvious gait abnormalities   [facility] Adult PT Treatment:                                                DATE: 07/06/23 Therapeutic Exercise: Reviewed and updated HEP discussing frequency, sets, reps, and ways to progress independently.   Therapeutic Activity: Re-assessment to determine overall progress, educating patient on progress towards goals.    [facility] Adult PT Treatment:                                                DATE: 07/02/23 Therapeutic Exercise: Treadmill speed 1.5 incline 2 x 5 minutes  Calf stretch on wedge x 1 minute  Resisted backward walking x 10 @ 15 lbs  Forward step downs 2 x 10 @ 4 inch  Leg press 2 x 10 @ 65 lbs  LAQ 2 x 10 @ 5 lbs Bridge with knee extension 2 x 10   Therapeutic Activity: Inclined walking outdoor surface x 100 ft Modalities: Game ready to Rt knee x 10 minutes; 34 degrees, medium compression     [facility] Adult PT Treatment:                                                DATE: 06/28/23 Therapeutic Exercise: Recumbent bike level 2 x 5 minutes  HS stretch with strap x 1 minute Calf stretch on wedge x 1 minute Step ups 8 inch step x 10; x 10 knee driver  Lateral step down 4 inch 2 x 10  Lateral step ups 8 inch step 2 x 10  SLR with square trace 2 x 10  Manual Therapy: IASTM gastroc/soleus, hamstrings, IT band RLE   Modalities: Game ready to Rt knee x 10 minutes; 34 degrees, medium compression      PATIENT EDUCATION:  Education details: see treatment; d/c education  Person educated: Patient Education method: Explanation, demo, cues, handout Education comprehension: verbalized understanding, returned demo,  cues   HOME EXERCISE PROGRAM: Access Code: [email or web address] URL: [email or web address] Date: 07/06/2023 Prepared by: Letitia Libra  Exercises - Supine  Hamstring Stretch with Strap  - 2 x daily - 7 x weekly - 3 sets - 20-30 sec  hold - Gastroc Stretch on Wall  - 1 x daily - 7 x weekly - 3 sets - 30 sec  hold - Prone Quadriceps Stretch with Strap  - 1 x daily - 7 x weekly - 3 sets - 30 sec  hold - Seated Long Arc Quad  - 2 x daily - 3 x weekly - 2 sets - 10 reps - Heel Raises with Counter Support  - 1 x daily - 3 x weekly - 2 sets - 10 reps - Supine Active Straight Leg Raise  - 1 x daily - 3 x weekly - 2 sets - 10 reps - Supine Bridge  - 1 x daily - 3 x weekly - 2 sets - 10 reps - Step Up  - 1 x daily - 3 x weekly - 2 sets - 10 reps - Lateral Step Up  - 1 x daily - 3 x weekly - 2 sets - 10 reps - Mini Squat with Counter Support  - 1 x daily - 3 x weekly - 2 sets - 10 reps - Wall Squat  - 1 x daily - 3 x weekly - 2 sets - 10 reps - Squat with Chair Touch  - 1 x daily - 3 x weekly - 2 sets - 10 reps - Squat  - 1 x daily - 3 x weekly - 2 sets - 10 reps - Single Leg Stance  - 1 x daily - 7 x weekly - 3 sets - 30 sec  hold  ASSESSMENT:  CLINICAL IMPRESSION: Rajvi has made excellent progress in PT s/p Rt medial meniscectomy on 06/01/23. She demonstrates full ROM and strength about the Rt knee and has returned to previous activity level without limitations as it relates to her knee. She has met the majority of established functional goals with exception of pain goal as she occasionally experiences 3-4/10 pain about the Rt knee. She is pleased with her progress and PT and feels that she can continue with her HEP independently. She is therefore appropriate for discharge at this time with patient in agreement with this plan.   OBJECTIVE IMPAIRMENTS: decreased activity tolerance, decreased endurance, decreased knowledge of condition, decreased mobility, difficulty walking, decreased ROM, decreased strength, increased edema, impaired flexibility, improper body mechanics, postural dysfunction, and pain.     GOALS: Goals reviewed with patient?  Yes  SHORT TERM GOALS: Target date: 06/28/2023   Patient will be independent and compliant with initial HEP.   Baseline: issued at eval  Goal status: Met   2.  Patient will demonstrate at least 120 degrees of Rt knee flexion AROM to improve ability to complete sit to stand transfers.  Baseline: see above Goal status: Met  3.  Patient will complete Rt SLR without quad lag indicative of improved knee stability.  Baseline: visible shaking with quad set  Goal status: Met     LONG TERM GOALS: Target date: 07/21/23  Patient will demonstrate proper squat mechanics without knee pain.  Baseline: avoids squatting currently 07/06/23: normal squat mechanics  Goal status: MET  2.  Patient will be able to ascend/descend stairs with reciprocal pattern.  Baseline: see subjective 07/06/23: reciprocal stepping  Goal status: MET  3.  Patient will return to previous exercise regimen  without limitations as it relates to her knee.  Baseline: currently walking 1/4 mile; previously walking 2 miles daily  Goal status: MET  4.  Patient will report pain at worst rated as </= 2/10 to improve sleep quality.  Baseline: see above 07/06/23: worst pain 3-4  Goal status: not met  5.  Patient will be independent with advanced home program to progress/maintain current level of function.  Baseline:  Goal status: MET   PLAN:  PT FREQUENCY: 2x/week  PT DURATION: 6 weeks  PLANNED INTERVENTIONS: 97164- PT Re-evaluation, 97110-Therapeutic exercises, 97530- Therapeutic activity, 97112- Neuromuscular re-education, 97535- Self Care, 40347- Manual therapy, [id number]- Gait training, [id number]- Aquatic Therapy, 97014- Electrical stimulation (unattended), [id number]- Electrical stimulation (manual), 97016- Vasopneumatic device, Taping, Dry Needling, Cryotherapy, and Moist heat  PLAN FOR NEXT SESSION: n/a d/c   Letitia Libra, PT, DPT, ATC 07/06/23 10:03 AM

## 2023-12-04 DIAGNOSIS — M65342 Trigger finger, left ring finger: Secondary | ICD-10-CM | POA: Diagnosis not present

## 2023-12-24 ENCOUNTER — Other Ambulatory Visit: Payer: Self-pay | Admitting: Family Medicine

## 2023-12-24 DIAGNOSIS — Z1231 Encounter for screening mammogram for malignant neoplasm of breast: Secondary | ICD-10-CM

## 2024-02-13 ENCOUNTER — Ambulatory Visit

## 2024-02-14 ENCOUNTER — Ambulatory Visit

## 2024-02-22 DIAGNOSIS — M069 Rheumatoid arthritis, unspecified: Secondary | ICD-10-CM | POA: Diagnosis not present

## 2024-02-22 DIAGNOSIS — Z79899 Other long term (current) drug therapy: Secondary | ICD-10-CM | POA: Diagnosis not present

## 2024-03-04 DIAGNOSIS — M65342 Trigger finger, left ring finger: Secondary | ICD-10-CM | POA: Diagnosis not present

## 2024-03-04 DIAGNOSIS — M1812 Unilateral primary osteoarthritis of first carpometacarpal joint, left hand: Secondary | ICD-10-CM | POA: Diagnosis not present

## 2024-03-05 ENCOUNTER — Ambulatory Visit (INDEPENDENT_AMBULATORY_CARE_PROVIDER_SITE_OTHER): Payer: Self-pay

## 2024-03-05 DIAGNOSIS — Z1231 Encounter for screening mammogram for malignant neoplasm of breast: Secondary | ICD-10-CM

## 2024-03-06 DIAGNOSIS — Z0000 Encounter for general adult medical examination without abnormal findings: Secondary | ICD-10-CM | POA: Diagnosis not present

## 2024-03-06 DIAGNOSIS — E119 Type 2 diabetes mellitus without complications: Secondary | ICD-10-CM | POA: Diagnosis not present

## 2024-03-06 DIAGNOSIS — E782 Mixed hyperlipidemia: Secondary | ICD-10-CM | POA: Diagnosis not present

## 2024-03-06 DIAGNOSIS — Z1211 Encounter for screening for malignant neoplasm of colon: Secondary | ICD-10-CM | POA: Diagnosis not present

## 2024-03-06 DIAGNOSIS — Z124 Encounter for screening for malignant neoplasm of cervix: Secondary | ICD-10-CM | POA: Diagnosis not present

## 2024-03-11 ENCOUNTER — Other Ambulatory Visit: Payer: Self-pay | Admitting: Family Medicine

## 2024-03-11 DIAGNOSIS — R928 Other abnormal and inconclusive findings on diagnostic imaging of breast: Secondary | ICD-10-CM

## 2024-03-31 ENCOUNTER — Ambulatory Visit
Admission: RE | Admit: 2024-03-31 | Discharge: 2024-03-31 | Disposition: A | Discharge status: Home / Self Care | Source: Ambulatory Visit | Attending: Family Medicine | Admitting: Family Medicine

## 2024-03-31 ENCOUNTER — Other Ambulatory Visit: Payer: Self-pay | Admitting: Family Medicine

## 2024-03-31 DIAGNOSIS — R928 Other abnormal and inconclusive findings on diagnostic imaging of breast: Secondary | ICD-10-CM

## 2024-03-31 DIAGNOSIS — N6489 Other specified disorders of breast: Secondary | ICD-10-CM

## 2024-04-07 DIAGNOSIS — E782 Mixed hyperlipidemia: Secondary | ICD-10-CM | POA: Diagnosis not present

## 2024-04-21 DIAGNOSIS — M1812 Unilateral primary osteoarthritis of first carpometacarpal joint, left hand: Secondary | ICD-10-CM | POA: Diagnosis not present

## 2024-04-21 DIAGNOSIS — M65342 Trigger finger, left ring finger: Secondary | ICD-10-CM | POA: Diagnosis not present

## 2024-04-24 DIAGNOSIS — Z79899 Other long term (current) drug therapy: Secondary | ICD-10-CM | POA: Diagnosis not present

## 2024-04-24 DIAGNOSIS — M069 Rheumatoid arthritis, unspecified: Secondary | ICD-10-CM | POA: Diagnosis not present

## 2024-05-22 DIAGNOSIS — M069 Rheumatoid arthritis, unspecified: Secondary | ICD-10-CM | POA: Diagnosis not present

## 2024-05-22 DIAGNOSIS — Z79899 Other long term (current) drug therapy: Secondary | ICD-10-CM | POA: Diagnosis not present

## 2024-06-02 DIAGNOSIS — M65342 Trigger finger, left ring finger: Secondary | ICD-10-CM | POA: Diagnosis not present

## 2024-06-02 DIAGNOSIS — M1812 Unilateral primary osteoarthritis of first carpometacarpal joint, left hand: Secondary | ICD-10-CM | POA: Diagnosis not present

## 2024-06-19 DIAGNOSIS — M069 Rheumatoid arthritis, unspecified: Secondary | ICD-10-CM | POA: Diagnosis not present

## 2024-06-19 DIAGNOSIS — Z79899 Other long term (current) drug therapy: Secondary | ICD-10-CM | POA: Diagnosis not present

## 2024-06-19 NOTE — Progress Notes (Signed)
 [facility]  - [facility] [place] [address] [place] [address] Dept: [id number]     Date: 06/19/2024                                                           Ordering Provider:  Aldona Ziolkowska, MD   Nicole Petersen      [birth year]           [id number]  Mode of Arrival:  ambulatory Transportation: self  Encounter Diagnoses  Name Primary?  . Drug therapy   . Rheumatoid arthritis involving multiple sites, unspecified whether rheumatoid factor present Yes    Vitals:   06/19/24 0912 06/19/24 0955 06/19/24 1004 06/19/24 1006  BP: 138/77 139/84 153/86 150/83  BP Location: Left arm     Patient Position: Sitting     Pulse: 76 79 61 78  Temp: 98 F (36.7 C)     TempSrc: Skin     SpO2: 95% 97% 97%   Weight: 79.7 kg (175 lb 11.3 oz)       Administrations This Visit     abatacept  (ORENCIA ) 750 mg in sodium chloride  0.9 % 100 mL IVPB     Admin Date 06/19/2024 Action New Bag Dose 750 mg Rate 200 mL/hr Route intravenous Documented By Alfonso JAYSON Chancy, RN             Medication  Orencia  Dose: 750 mg Wastage: 0 Medication provided by [facility]   24 gauge angiocatheter  was placed in the right antecubital on the 1st attempt. Blood return observed, catheter was flushed with normal saline without difficulty and is absent of redness, swelling or pain; site secured with dressing and taped appropriately for infusion. Access was removed per protocol after completion of infusion.  The catheter tip was intact.  No redness, swelling or pain noted at the insertion site.  Area was covered with gauze and coban.  Patient tolerated the infusion well.   Patient was discharged ambulatory  Labs collected: none Last Quantiferon was drawn 09/11/2023 and the result was Negative  Infusion Nurse Signature:  Alfonso JAYSON Marrow, RN  Supervision: Aldona Ziolkowska, MD   Infusion start time  [phone]  Infusion Stop time  1006

## 2024-07-04 ENCOUNTER — Encounter (HOSPITAL_BASED_OUTPATIENT_CLINIC_OR_DEPARTMENT_OTHER): Payer: Self-pay | Admitting: Orthopedic Surgery

## 2024-07-04 ENCOUNTER — Other Ambulatory Visit: Payer: Self-pay

## 2024-07-14 DIAGNOSIS — Z79899 Other long term (current) drug therapy: Secondary | ICD-10-CM | POA: Diagnosis not present

## 2024-07-14 DIAGNOSIS — M069 Rheumatoid arthritis, unspecified: Secondary | ICD-10-CM | POA: Diagnosis not present

## 2024-07-15 ENCOUNTER — Encounter (HOSPITAL_BASED_OUTPATIENT_CLINIC_OR_DEPARTMENT_OTHER): Payer: Self-pay | Admitting: Orthopedic Surgery

## 2024-07-15 NOTE — Anesthesia Preprocedure Evaluation (Signed)
 Anesthesia Evaluation  Patient identified by MRN, date of birth, ID band Patient awake    Reviewed: Allergy & Precautions, NPO status , Patient's Chart, lab work & pertinent test results  Airway Mallampati: II  TM Distance: >3 FB Neck ROM: Full    Dental no notable dental hx. (+) Teeth Intact, Caps, Dental Advisory Given   Pulmonary neg pulmonary ROS   Pulmonary exam normal breath sounds clear to auscultation       Cardiovascular negative cardio ROS Normal cardiovascular exam Rhythm:Regular Rate:Normal     Neuro/Psych  PSYCHIATRIC DISORDERS Anxiety Depression     Neuromuscular disease    GI/Hepatic negative GI ROS,,,Hepatic steatosis   Endo/Other  Pre diabetes Hyperlipidemia  Renal/GU negative Renal ROS   Frequent urination    Musculoskeletal  (+) Arthritis , Rheumatoid disorders,  Left thumb carpometacarpal osteoarthritis Left ring finger stenosing tenosynovitis    Abdominal   Peds  Hematology negative hematology ROS (+)     Anesthesia Other Findings   Reproductive/Obstetrics                              Anesthesia Physical Anesthesia Plan  ASA: 2  Anesthesia Plan: MAC and Regional   Post-op Pain Management: Minimal or no pain anticipated   Induction: Intravenous  PONV Risk Score and Plan: 3 and Treatment may vary due to age or medical condition and Propofol  infusion  Airway Management Planned: Natural Airway and Simple Face Mask  Additional Equipment: None  Intra-op Plan:   Post-operative Plan:   Informed Consent: I have reviewed the patients History and Physical, chart, labs and discussed the procedure including the risks, benefits and alternatives for the proposed anesthesia with the patient or authorized representative who has indicated his/her understanding and acceptance.     Dental advisory given  Plan Discussed with: CRNA and Anesthesiologist  Anesthesia  Plan Comments:          Anesthesia Quick Evaluation

## 2024-07-15 NOTE — H&P (Signed)
 HAND SURGERY   HPI: Patient is a 58 y.o. female who presents with left thumb CMC osteoarthritis that has failed conservative management.  Patient denies any changes to their medical history or new systemic symptoms today.    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)    Past Surgical History:  Procedure Laterality Date   BREAST BIOPSY     per pt   CESAREAN SECTION     KNEE ARTHROSCOPY WITH MEDIAL MENISECTOMY Right 06/01/2023   Procedure: KNEE ARTHROSCOPY WITH PARITAL  MEDIAL MENISECTOMY;  Surgeon: Sharl Selinda Dover, MD;  Location: [facility];  Service: Orthopedics;  Laterality: Right;   WISDOM TOOTH EXTRACTION     Social History   Socioeconomic History   Marital status: Married    Spouse name: Not on file   Number of children: Not on file   Years of education: Not on file   Highest education level: Not on file  Occupational History   Not on file  Tobacco Use   Smoking status: Never   Smokeless tobacco: Never  Vaping Use   Vaping status: Never Used  Substance and Sexual Activity   Alcohol use: No    Alcohol/week: 0.0 standard drinks of alcohol   Drug use: No   Sexual activity: Yes    Birth control/protection: Post-menopausal  Other Topics Concern   Not on file  Social History Narrative   Lives with husband in a one story home but does have to go up a flight of stairs to get into house.  Has one son in the marines.     Works as a airline pilot.  Education: high school.   Social Drivers of Corporate Investment Banker Strain: Not on file  Food Insecurity: Not on file  Transportation Needs: Not on file  Physical Activity: Not on file  Stress: Not on file  Social Connections: Unknown (12/26/2021)   Received from  [facility]   Social Network    Social Network: Not on file   Family History  Problem Relation Age of Onset   Heart disease Father        Deceased   Diabetes Mother    Heart disease Mother        Deceased   Hypertension Mother    Lymphoma Brother        Deceased   Healthy Sister    Healthy Brother    Healthy Son    - negative except otherwise stated in the family history section No Known Allergies Prior to Admission medications   Medication Sig Start Date End Date Taking? Authorizing Provider  Abatacept  (ORENCIA  IV) Inject into the vein every 30 (thirty) days.   Yes [provider]  DULoxetine (CYMBALTA) 30 MG capsule Take 30 mg by mouth 2 (two) times daily.   Yes [provider]  gabapentin (NEURONTIN) 300 MG capsule Take 300 mg by mouth at bedtime.   Yes [provider]  rosuvastatin (CRESTOR) 5 MG tablet Take 5 mg by mouth daily.   Yes [provider]   No results found. - Positive ROS: All other systems have been reviewed and were otherwise negative with the exception of those mentioned in  the HPI and as above.  Physical Exam: General: No acute distress, resting comfortably Cardiovascular: BUE warm and well perfused, normal rate Respiratory: Normal WOB on RA Skin: Warm and dry Neurologic: Sensation intact distally Psychiatric: Patient is at baseline mood and affect  Left Upper Extremity   Positive CMC grind test with pain and crepitus. No thumb palmar abduction contracture. No static\dynamic MCP hyper-extension.  Negative Finkelstein test  No thumb IP joint triggering. Full and painless AROM of wrist. Full and painless AROM of remaining fingers with TTP over ring finger A1 pulley with palpable and visible locking/catching. Sensation intact to light touch in the m/u/r distribution. Fingers warm and well perfused with brisk capillary refill.   Assessment: 58 yo F w/ left thumb CMC OA that has failed conservative  management.   Plan: OR today for left trapeziectomy with LRTI.  We reviewed the risks of surgery which include bleeding, infection, damage to neurovascular structures, persistent symptoms, stiffness, thumb weakness, delayed wound healing, need for additional surgery.    We reviewed the expected postoperative course and recovery process.   Bebe Galla, M.D. [facility] 9:42 PM

## 2024-07-16 ENCOUNTER — Ambulatory Visit (HOSPITAL_BASED_OUTPATIENT_CLINIC_OR_DEPARTMENT_OTHER)

## 2024-07-16 ENCOUNTER — Encounter (HOSPITAL_BASED_OUTPATIENT_CLINIC_OR_DEPARTMENT_OTHER): Payer: Self-pay | Admitting: Anesthesiology

## 2024-07-16 ENCOUNTER — Encounter (HOSPITAL_BASED_OUTPATIENT_CLINIC_OR_DEPARTMENT_OTHER)
Admission: RE | Disposition: A | Discharge status: Home / Self Care | Payer: Self-pay | Source: Home / Self Care | Attending: Orthopedic Surgery

## 2024-07-16 ENCOUNTER — Other Ambulatory Visit: Payer: Self-pay

## 2024-07-16 ENCOUNTER — Ambulatory Visit (HOSPITAL_BASED_OUTPATIENT_CLINIC_OR_DEPARTMENT_OTHER)
Admission: RE | Admit: 2024-07-16 | Discharge: 2024-07-16 | Disposition: A | Discharge status: Home / Self Care | Attending: Orthopedic Surgery | Admitting: Orthopedic Surgery

## 2024-07-16 ENCOUNTER — Encounter (HOSPITAL_BASED_OUTPATIENT_CLINIC_OR_DEPARTMENT_OTHER): Payer: Self-pay | Admitting: Orthopedic Surgery

## 2024-07-16 ENCOUNTER — Ambulatory Visit (HOSPITAL_BASED_OUTPATIENT_CLINIC_OR_DEPARTMENT_OTHER): Payer: Self-pay | Admitting: Anesthesiology

## 2024-07-16 DIAGNOSIS — M65842 Other synovitis and tenosynovitis, left hand: Secondary | ICD-10-CM | POA: Diagnosis not present

## 2024-07-16 DIAGNOSIS — E785 Hyperlipidemia, unspecified: Secondary | ICD-10-CM | POA: Diagnosis not present

## 2024-07-16 DIAGNOSIS — R7303 Prediabetes: Secondary | ICD-10-CM | POA: Diagnosis not present

## 2024-07-16 DIAGNOSIS — Z01818 Encounter for other preprocedural examination: Secondary | ICD-10-CM

## 2024-07-16 DIAGNOSIS — F418 Other specified anxiety disorders: Secondary | ICD-10-CM | POA: Diagnosis not present

## 2024-07-16 DIAGNOSIS — M069 Rheumatoid arthritis, unspecified: Secondary | ICD-10-CM | POA: Diagnosis not present

## 2024-07-16 DIAGNOSIS — M65342 Trigger finger, left ring finger: Secondary | ICD-10-CM | POA: Diagnosis not present

## 2024-07-16 DIAGNOSIS — R350 Frequency of micturition: Secondary | ICD-10-CM | POA: Diagnosis not present

## 2024-07-16 DIAGNOSIS — K760 Fatty (change of) liver, not elsewhere classified: Secondary | ICD-10-CM | POA: Diagnosis not present

## 2024-07-16 DIAGNOSIS — G709 Myoneural disorder, unspecified: Secondary | ICD-10-CM | POA: Diagnosis not present

## 2024-07-16 DIAGNOSIS — M1812 Unilateral primary osteoarthritis of first carpometacarpal joint, left hand: Secondary | ICD-10-CM | POA: Diagnosis not present

## 2024-07-16 HISTORY — PX: TRIGGER FINGER RELEASE: SHX641

## 2024-07-16 HISTORY — DX: Depression, unspecified: F32.A

## 2024-07-16 HISTORY — PX: CARPOMETACARPEL SUSPENSION PLASTY: SHX5005

## 2024-07-16 HISTORY — DX: Prediabetes: R73.03

## 2024-07-16 SURGERY — CARPOMETACARPEL (CMC) SUSPENSION PLASTY
Anesthesia: Monitor Anesthesia Care | Site: Hand | Laterality: Left

## 2024-07-16 MED ORDER — CEFAZOLIN SODIUM-DEXTROSE 2-4 GM/100ML-% IV SOLN
INTRAVENOUS | Status: AC
  Filled 2024-07-16: qty 100

## 2024-07-16 MED ORDER — ROPIVACAINE HCL 5 MG/ML IJ SOLN
INTRAMUSCULAR | Status: DC | PRN
  Administered 2024-07-16: 30 mL via PERINEURAL

## 2024-07-16 MED ORDER — LIDOCAINE HCL (CARDIAC) PF 100 MG/5ML IV SOSY
PREFILLED_SYRINGE | INTRAVENOUS | Status: DC | PRN
  Administered 2024-07-16: 60 mg via INTRAVENOUS

## 2024-07-16 MED ORDER — PROPOFOL 500 MG/50ML IV EMUL
INTRAVENOUS | Status: DC | PRN
  Administered 2024-07-16: 100 ug/kg/min via INTRAVENOUS

## 2024-07-16 MED ORDER — LACTATED RINGERS IV SOLN: INTRAVENOUS | Status: DC

## 2024-07-16 MED ORDER — LIDOCAINE 2% (20 MG/ML) 5 ML SYRINGE
INTRAMUSCULAR | Status: AC
  Filled 2024-07-16: qty 5

## 2024-07-16 MED ORDER — 0.9 % SODIUM CHLORIDE (POUR BTL) OPTIME
TOPICAL | Status: DC | PRN
  Administered 2024-07-16: 500 mL

## 2024-07-16 MED ORDER — MIDAZOLAM HCL 2 MG/2ML IJ SOLN
INTRAMUSCULAR | Status: AC
  Filled 2024-07-16: qty 2

## 2024-07-16 MED ORDER — OXYCODONE HCL 5 MG/5ML PO SOLN: 5.0000 mg | Freq: Once | ORAL | Status: DC | PRN

## 2024-07-16 MED ORDER — PROPOFOL 10 MG/ML IV BOLUS
INTRAVENOUS | Status: AC
  Filled 2024-07-16: qty 20

## 2024-07-16 MED ORDER — ONDANSETRON HCL 4 MG/2ML IJ SOLN
INTRAMUSCULAR | Status: AC
  Filled 2024-07-16: qty 2

## 2024-07-16 MED ORDER — OXYCODONE HCL 5 MG PO TABS: 5.0000 mg | ORAL_TABLET | ORAL | 0 refills | Status: AC | PRN

## 2024-07-16 MED ORDER — FENTANYL CITRATE (PF) 100 MCG/2ML IJ SOLN
50.0000 ug | Freq: Once | INTRAMUSCULAR | Status: AC
  Administered 2024-07-16: 50 ug via INTRAVENOUS

## 2024-07-16 MED ORDER — ONDANSETRON HCL 4 MG/2ML IJ SOLN
INTRAMUSCULAR | Status: DC | PRN
  Administered 2024-07-16: 4 mg via INTRAVENOUS

## 2024-07-16 MED ORDER — FENTANYL CITRATE (PF) 100 MCG/2ML IJ SOLN
INTRAMUSCULAR | Status: AC
  Filled 2024-07-16: qty 2

## 2024-07-16 MED ORDER — OXYCODONE HCL 5 MG PO TABS: 5.0000 mg | ORAL_TABLET | Freq: Once | ORAL | Status: DC | PRN

## 2024-07-16 MED ORDER — MIDAZOLAM HCL (PF) 2 MG/2ML IJ SOLN
1.0000 mg | Freq: Once | INTRAMUSCULAR | Status: AC
  Administered 2024-07-16: 1 mg via INTRAVENOUS

## 2024-07-16 MED ORDER — HYDROMORPHONE HCL 1 MG/ML IJ SOLN: 0.2500 mg | INTRAMUSCULAR | Status: DC | PRN

## 2024-07-16 MED ORDER — DROPERIDOL 2.5 MG/ML IJ SOLN: 0.6250 mg | Freq: Once | INTRAMUSCULAR | Status: DC | PRN

## 2024-07-16 MED ORDER — ONDANSETRON HCL 4 MG/2ML IJ SOLN: 4.0000 mg | Freq: Once | INTRAMUSCULAR | Status: DC | PRN

## 2024-07-16 MED ORDER — PROPOFOL 10 MG/ML IV BOLUS
INTRAVENOUS | Status: DC | PRN
  Administered 2024-07-16: 20 mg via INTRAVENOUS

## 2024-07-16 MED ORDER — CEFAZOLIN SODIUM-DEXTROSE 2-4 GM/100ML-% IV SOLN
2.0000 g | INTRAVENOUS | Status: AC
  Administered 2024-07-16: 2 g via INTRAVENOUS

## 2024-07-16 SURGICAL SUPPLY — 51 items
BLADE SURG 15 STRL LF DISP TIS (BLADE) ×2 IMPLANT
BNDG COMPR ESMARK 4X3 LF (GAUZE/BANDAGES/DRESSINGS) ×1 IMPLANT
BNDG ELASTIC 3INX 5YD STR LF (GAUZE/BANDAGES/DRESSINGS) IMPLANT
BNDG ELASTIC 4INX 5YD STR LF (GAUZE/BANDAGES/DRESSINGS) ×1 IMPLANT
BNDG GAUZE DERMACEA FLUFF 4 (GAUZE/BANDAGES/DRESSINGS) ×1 IMPLANT
BNDG PLASTER X FAST 3X3 WHT LF (CAST SUPPLIES) IMPLANT
BNDG PLASTER X FAST 4X5 WHT LF (CAST SUPPLIES) IMPLANT
CHLORAPREP W/TINT 26 (MISCELLANEOUS) ×1 IMPLANT
CORD BIPOLAR FORCEPS 12FT (ELECTRODE) ×1 IMPLANT
COVER BACK TABLE 60X90IN (DRAPES) ×1 IMPLANT
COVER MAYO STAND STRL (DRAPES) ×1 IMPLANT
CUFF TOURN SGL QUICK 18X4 (TOURNIQUET CUFF) IMPLANT
CUFF TRNQT CYL 24X4X16.5-23 (TOURNIQUET CUFF) IMPLANT
DERMABOND ADVANCED .7 DNX12 (GAUZE/BANDAGES/DRESSINGS) IMPLANT
DRAPE EXTREMITY T 121X128X90 (DISPOSABLE) ×1 IMPLANT
DRAPE OEC MINIVIEW 54X84 (DRAPES) ×1 IMPLANT
DRAPE SURG 17X23 STRL (DRAPES) ×1 IMPLANT
GAUZE SPONGE 4X4 12PLY STRL (GAUZE/BANDAGES/DRESSINGS) ×1 IMPLANT
GAUZE XEROFORM 1X8 LF (GAUZE/BANDAGES/DRESSINGS) ×1 IMPLANT
GLOVE BIO SURGEON STRL SZ7 (GLOVE) ×1 IMPLANT
GLOVE BIOGEL PI IND STRL 6.5 (GLOVE) IMPLANT
GLOVE BIOGEL PI IND STRL 7.0 (GLOVE) ×1 IMPLANT
GLOVE SURG SS PI 6.5 STRL IVOR (GLOVE) IMPLANT
GOWN STRL REUS W/ TWL LRG LVL3 (GOWN DISPOSABLE) ×2 IMPLANT
KWIRE DBL .045X4 NSTRL (WIRE) IMPLANT
NDL HYPO 25X1 1.5 SAFETY (NEEDLE) IMPLANT
NEEDLE HYPO 25X1 1.5 SAFETY (NEEDLE) IMPLANT
PACK BASIN DAY SURGERY FS (CUSTOM PROCEDURE TRAY) ×1 IMPLANT
PAD CAST 3X4 CTTN HI CHSV (CAST SUPPLIES) ×1 IMPLANT
SHEET MEDIUM DRAPE 40X70 STRL (DRAPES) ×1 IMPLANT
SLEEVE SCD COMPRESS KNEE MED (STOCKING) IMPLANT
SLING ARM FOAM STRAP LRG (SOFTGOODS) IMPLANT
SLING ARM FOAM STRAP MED (SOFTGOODS) IMPLANT
SOLN 0.9% NACL POUR BTL 1000ML (IV SOLUTION) ×1 IMPLANT
SPLINT FIBERGLASS 4X30 (CAST SUPPLIES) ×1 IMPLANT
STRIP CLOSURE SKIN 1/2X4 (GAUZE/BANDAGES/DRESSINGS) IMPLANT
SUCTION TUBE FRAZIER 10FR DISP (SUCTIONS) IMPLANT
SUT ETHIBOND 3-0 V-5 (SUTURE) IMPLANT
SUT ETHILON 4 0 PS 2 18 (SUTURE) ×1 IMPLANT
SUT MERSILENE 4 0 P 3 (SUTURE) ×1 IMPLANT
SUT MNCRL AB 3-0 PS2 18 (SUTURE) IMPLANT
SUT MNCRL AB 4-0 PS2 18 (SUTURE) IMPLANT
SUT SUPRAMID 3-0 (SUTURE) IMPLANT
SUT VIC AB 4-0 PS2 18 (SUTURE) IMPLANT
SUT VICRYL RAPIDE 4/0 PS 2 (SUTURE) IMPLANT
SYR BULB EAR ULCER 3OZ GRN STR (SYRINGE) ×1 IMPLANT
SYR CONTROL 10ML LL (SYRINGE) IMPLANT
SYSTEM LIGAMENT RCNST IMPLNT S (Miscellaneous) IMPLANT
TOWEL GREEN STERILE FF (TOWEL DISPOSABLE) ×2 IMPLANT
TUBE CONNECTING 20X1/4 (TUBING) IMPLANT
UNDERPAD 30X36 HEAVY ABSORB (UNDERPADS AND DIAPERS) ×1 IMPLANT

## 2024-07-16 NOTE — Anesthesia Procedure Notes (Signed)
 Anesthesia Regional Block: Supraclavicular block   Pre-Anesthetic Checklist: , timeout performed,  Correct Patient, Correct Site, Correct Laterality,  Correct Procedure, Correct Position, site marked,  Risks and benefits discussed,  Surgical consent,  Pre-op evaluation,  At surgeon's request and post-op pain management  Laterality: Left  Prep: chloraprep       Needles:  Injection technique: Single-shot  Needle Type: Echogenic Stimulator Needle     Needle Length: 10cm  Needle Gauge: 21   Needle insertion depth: 6 cm   Additional Needles:   Procedures:,,,, ultrasound used (permanent image in chart),,   Motor weakness within 5 minutes.  Narrative:  Start time: 07/16/2024 9:33 AM End time: 07/16/2024 9:38 AM Injection made incrementally with aspirations every 5 mL.  Performed by: Personally  Anesthesiologist: Jerrye Sharper, MD  Additional Notes: Timeout performed. Patient sedated. Relevant anatomy ID'd using US . Incremental 2-5ml injection of LA with frequent aspiration. Patient tolerated procedure well.

## 2024-07-16 NOTE — Discharge Instructions (Addendum)
 Carlin Galla, M.D. Hand Surgery  POST-OPERATIVE DISCHARGE INSTRUCTIONS   PRESCRIPTIONS: You may have been given a prescription to be taken as directed for post-operative pain control.  You may also take over the counter ibuprofen /aleve and tylenol  for pain. Take this as directed on the packaging. Do not exceed 3000 mg tylenol /acetaminophen  in 24 hours.  Ibuprofen  600-800 mg (3-4) tablets by mouth every 6 hours as needed for pain.  OR Aleve 2 tablets by mouth every 12 hours (twice daily) as needed for pain.  AND/OR Tylenol  1000 mg (2 tablets) every 8 hours as needed for pain.  Please use your pain medication carefully, as refills are limited and you may not be provided with one.  As stated above, please use over the counter pain medicine - it will also be helpful with decreasing your swelling.    ANESTHESIA: After your surgery, post-surgical discomfort or pain is likely. This discomfort can last several days to a few weeks. At certain times of the day your discomfort may be more intense.   Did you receive a nerve block?  A nerve block can provide pain relief for one hour to two days after your surgery. As long as the nerve block is working, you will experience little or no sensation in the area the surgeon operated on.  As the nerve block wears off, you will begin to experience pain or discomfort. It is very important that you begin taking your prescribed pain medication before the nerve block fully wears off. Treating your pain at the first sign of the block wearing off will ensure your pain is better controlled and more tolerable when full-sensation returns. Do not wait until the pain is intolerable, as the medicine will be less effective. It is better to treat pain in advance than to try and catch up.   General Anesthesia:  If you did not receive a nerve block during your surgery, you will need to start taking your pain medication shortly after your surgery and should continue  to do so as prescribed by your surgeon.     ICE AND ELEVATION: You may use ice for the first 48-72 hours, but it is not critical.   Motion of your fingers is very important to decrease the swelling.  Elevation, as much as possible for the next 48 hours, is critical for decreasing swelling as well as for pain relief. Elevation means when you are seated or lying down, you hand should be at or above your heart. When walking, the hand needs to be at or above the level of your elbow.  If the bandage gets too tight, it may need to be loosened. Please contact our office and we will instruct you in how to do this.    SURGICAL BANDAGES:  Keep your dressing and/or splint clean and dry at all times.  Do not remove until you are seen again in the office.  If careful, you may place a plastic bag over your bandage and tape the end to shower, but be careful, do not get your bandages wet.     HAND THERAPY:  You will be contacted to set up your first therapy visit.    ACTIVITY AND WORK: You are encouraged to move any fingers which are not in the bandage.  Light use of the fingers is allowed to assist the other hand with daily hygiene and eating, but strong gripping or lifting is often uncomfortable and should be avoided.  You might miss a variable  period of time from work and hopefully this issue has been discussed prior to surgery. You may not do any heavy work with your affected hand for about 2 weeks.    [facility] Second Floor, [address] [place], [place] [address] [phone]  Post Anesthesia Home Care Instructions  Activity: Get plenty of rest for the remainder of the day. A responsible individual must stay with you for 24 hours following the procedure.  For the next 24 hours, DO NOT: -Drive a car -Advertising copywriter -Drink alcoholic beverages -Take any medication unless instructed by your physician -Make any legal decisions or sign important papers.  Meals: Start with  liquid foods such as gelatin or soup. Progress to regular foods as tolerated. Avoid greasy, spicy, heavy foods. If nausea and/or vomiting occur, drink only clear liquids until the nausea and/or vomiting subsides. Call your physician if vomiting continues.  Special Instructions/Symptoms: Your throat may feel dry or sore from the anesthesia or the breathing tube placed in your throat during surgery. If this causes discomfort, gargle with warm salt water . The discomfort should disappear within 24 hours.  If you had a scopolamine patch placed behind your ear for the management of post- operative nausea and/or vomiting:  1. The medication in the patch is effective for 72 hours, after which it should be removed.  Wrap patch in a tissue and discard in the trash. Wash hands thoroughly with soap and water . 2. You may remove the patch earlier than 72 hours if you experience unpleasant side effects which may include dry mouth, dizziness or visual disturbances. 3. Avoid touching the patch. Wash your hands with soap and water  after contact with the patch.       Regional Anesthesia Blocks  1. You may not be able to move or feel the blocked extremity after a regional anesthetic block. This may last may last from 3-48 hours after placement, but it will go away. The length of time depends on the medication injected and your individual response to the medication. As the nerves start to wake up, you may experience tingling as the movement and feeling returns to your extremity. If the numbness and inability to move your extremity has not gone away after 48 hours, please call your surgeon.   2. The extremity that is blocked will need to be protected until the numbness is gone and the strength has returned. Because you cannot feel it, you will need to take extra care to avoid injury. Because it may be weak, you may have difficulty moving it or using it. You may not know what position it is in without looking at it  while the block is in effect.  3. For blocks in the legs and feet, returning to weight bearing and walking needs to be done carefully. You will need to wait until the numbness is entirely gone and the strength has returned. You should be able to move your leg and foot normally before you try and bear weight or walk. You will need someone to be with you when you first try to ensure you do not fall and possibly risk injury.  4. Bruising and tenderness at the needle site are common side effects and will resolve in a few days.  5. Persistent numbness or new problems with movement should be communicated to the surgeon or the [facility] [phone] [facility] [phone]).

## 2024-07-16 NOTE — Progress Notes (Signed)
Assisted Dr. Foster with left, supraclavicular, ultrasound guided block. Side rails up, monitors on throughout procedure. See vital signs in flow sheet. Tolerated Procedure well. 

## 2024-07-16 NOTE — Op Note (Signed)
 Date of Surgery: 07/16/2024  INDICATIONS: Patient is a 58 y.o.-year-old female with left thumb cmc osteoarthritis and ring finger stenosing tenosynovitis both of which have failed conservative management.  Risks, benefits, and alternatives to surgery were again discussed with the patient in the preoperative area. The patient wishes to proceed with surgery.  Informed consent was signed after our discussion.   PREOPERATIVE DIAGNOSIS:  Left ring trigger finger Left thumb CMC osteoarthritis  POSTOPERATIVE DIAGNOSIS: Same.  PROCEDURE:  Left ring finger A1 pulley release Left trapeziectomy with ligament reconstruction and tendon interposition   SURGEON: Carlin Galla, M.D.  ASSIST: None  ANESTHESIA:  Regional, MAC  IV FLUIDS AND URINE: See anesthesia.  ESTIMATED BLOOD LOSS: 10 mL.  IMPLANTS:  Implant Name Type Inv. Item Serial No. Manufacturer Lot No. LRB No. Used Action  SYSTEM LIGAMENT RCNST IMPLNT S - [id number] Miscellaneous SYSTEM LIGAMENT RCNST IMPLNT S  [id number] Left 1 Implanted     DRAINS: None  COMPLICATIONS: None noted  DESCRIPTION OF PROCEDURE:   The patient was met in the preoperative holding area where the surgical site was marked and the consent form was signed.  A regional block was performed by anesthesia.  The patient was then taken to the operating room and remained on the stretcher.  All bony prominences were well padded.  A hand table was placed adjacent to the operative extremity and locked into place.  A tourniquet was applied to the upper arm.  Monitored anesthesia was induced.  The operative extremity was prepped and draped in the usual and sterile fashion.  A formal time-out was performed to confirm that this was the correct patient, surgery, side, and site. All necessary equipment was in the room.   Following formal timeout, the limb was gently exsanguinated with an Esmarch bandage and the tourniquet inflated to 250 mmHg. A longitudinal  incision was made over the A1 pulley.  The skin was incised.  Blunt dissection was used to identify the A1 pulley.  Two Ragnell retractors were placed on the radial and ulnar sides of the pulley to protect the respective neurovascular bundles.  A third Ragnell was placed at the distal aspect of the wound.  The A1 pulley was clearly identified.  Under direct visualization, the pulley was entered sharply using a 15 blade.  Tenotomy scissors were used to complete the pulley release distally to the level of the A2 pulley.  The distal retractor was then placed in the proximal aspect of the wound.  Under direct visualization, the proximal aspect of the A1 pulley was completely released. There was no palpable locking/catching with passive range of motion. The wound was then thoroughly irrigated.  It was closed using 4-0 vicryl rapide sutures in a horizontal mattress fashion.    I then made a longitudinal incision was made over the dorsal aspect of the thumb from just distal to the [facility] joint to just distal to the radial styloid.  The skin was incised.  Blunt dissection was used with a tenotomy scissor to identify branches of the superficial radial nerve.  These were protected throughout the surgery.  Small superficial veins were coagulated with bipolar cautery as needed.  The APL and EPB tendons were identified.  The fascia overlying the tendons was divided using a tenotomy scissor.  Blunt dissection was used to identify the branch of the radial artery overlying the trapezium.  The artery was dissected free of adjacent soft tissue as was the accompanying vein.  Small branches were coagulated  to allow for mobilization of the vessels.  A Weitlaner retractor was placed in the wound retracting the radial artery and accompanying vein away from the overlying joint capsule.  A 15 blade was used to perform a capsulotomy over the dorsal aspect of the metacarpal and along the dorsal aspect of the trapezium.  The capsuloligamentous  structures were then circumferentially divided from their insertion on the trapezium.  A McGlamry elevator was then used to divide the remaining capsular ligamentous structures taking care to protect the underlying flexor carpi radialis tendon.  The trapezium was removed en bloc.  There was significant eburnation of the articular surface.  There was similar eburnation of the base of the metacarpal.  A Freer elevator was used to inspect the articulation between the trapezoid and the scaphoid.  There were no degenerative changes present.  The FCR tendon was then inspected and was found to be intact.  A tenotomy scissor was used to free up the tendon all the way to its insertion.  I then turned my attention to the volar aspect of the forearm.  A transverse incision was made over the wrist flexion crease directly of the FCR tendon.  A second transverse incision was made 10 cm proximal.  The flexor carpi radialis tendon was identified and dissected free of the surrounding soft tissues.  Great care was taken to ensure that this was in fact the FCR tendon.  Tendon was transected proximally.   The tendon was then pulled into the trapeziectomy space.  The guidewire was then driven from the dorsal and central aspect of the metacarpal base approximately 1 cm distal to the articular surface to the volar margin of the articular surface.  This was then overdrilled with a 4 mm cannulated drill.  The tendon was then passed through this tunnel using the QuickPass tendon shuttle device.  The thumb was then held in appropriate position and a 4 mm interference screw was advanced until it was flush with the cortex.  This provided appropriate suspension of the thumb metacarpal.  A 3-0 Ethibond suture was then placed within the trapeziectomy space through the volar capsule.  The remaining flexor carpi radialis tendon was folded upon itself and sutured into place to serve as interposition material.  The wound was then thoroughly irrigated  with copious sterile saline.  The capsule was closed using a 4-0 Mersilene suture.  The tourniquet was deflated.  Hemostasis was achieved with direct pressure over the wound.  The skin was then closed in a layered fashion using a 3-0 Monocryl suture followed by a 4-0 vicryl rapide suture.  The two incisions in the forearm were closed in a similar manner using a 4-0 vicryl rapide suture in horizontal mattress fashion.    The wounds were then cleaned and dressed with Xeroform, folded Kerlix, cast padding, and a well-padded thumb spica splint was applied.   The patient was reversed from sedation.  All counts were correct x 2 at the end of the procedure.  The patient was then taken to the PACU in stable condition.     POSTOPERATIVE PLAN: She will be discharged to home with appropriate pain medication and discharge instructions.  A referral was made to hand therapy.  I'll see her back in the office in 10-14 days for her first postop visit.   Carlin Galla, MD 12:13 PM

## 2024-07-16 NOTE — Transfer of Care (Signed)
 Immediate Anesthesia Transfer of Care Note  Patient: Nicole Petersen  Procedure(s) Performed: LEFT TRAPEZIECTOMY WITH LIGAMENT RECONSTRUCTION AND TENDON INTERPOSITION (Left: Hand) LEFT RING FINGER A1 PULLEY RELEASE (Left: Hand)  Patient Location: PACU  Anesthesia Type:MAC combined with regional for post-op pain  Level of Consciousness: drowsy, patient cooperative, and responds to stimulation  Airway & Oxygen Therapy: Patient Spontanous Breathing and Patient connected to nasal cannula oxygen  Post-op Assessment: Report given to RN and Post -op Vital signs reviewed and stable  Post vital signs: Reviewed and stable  Last Vitals:  Vitals Value Taken Time  BP    Temp    Pulse 78 07/16/24 12:13  Resp 14 07/16/24 12:13  SpO2 96 % 07/16/24 12:13  Vitals shown include unfiled device data.  Last Pain:  Vitals:   07/16/24 0910  PainSc: 0-No pain      Patients Stated Pain Goal: 4 (07/16/24 0910)  Complications: No notable events documented.

## 2024-07-16 NOTE — Anesthesia Postprocedure Evaluation (Signed)
 Anesthesia Post Note  Patient: Nicole Petersen  Procedure(s) Performed: LEFT TRAPEZIECTOMY WITH LIGAMENT RECONSTRUCTION AND TENDON INTERPOSITION (Left: Hand) LEFT RING FINGER A1 PULLEY RELEASE (Left: Hand)     Patient location during evaluation: PACU Anesthesia Type: Regional and MAC Level of consciousness: awake and alert Pain management: pain level controlled Vital Signs Assessment: post-procedure vital signs reviewed and stable Respiratory status: spontaneous breathing, nonlabored ventilation and respiratory function stable Cardiovascular status: stable and blood pressure returned to baseline Postop Assessment: no apparent nausea or vomiting Anesthetic complications: no   No notable events documented.  Last Vitals:  Vitals:   07/16/24 1215 07/16/24 1230  BP: (!) 116/92 132/85  Pulse: 83 74  Resp: 13 13  Temp:    SpO2: 96% 94%    Last Pain:  Vitals:   07/16/24 1230  PainSc: 0-No pain                 Marie Borowski A.

## 2024-07-16 NOTE — Interval H&P Note (Signed)
 History and Physical Interval Note:  07/16/2024 10:22 AM  Nicole Petersen  has presented today for surgery, with the diagnosis of Left thumb carpometacarpal osteoarthritis, left ring finger stenosing tenosynovitis.  The various methods of treatment have been discussed with the patient and family. After consideration of risks, benefits and other options for treatment, the patient has consented to  Procedure(s) with comments: CARPOMETACARPEL (CMC) SUSPENSION PLASTY (Left) - Left trapeziectomy with ligament reconstruction and tendon interposition.  Left ring finger A1 pulley release as a surgical intervention.  The patient's history has been reviewed, patient examined, no change in status, stable for surgery.  I have reviewed the patient's chart and labs.  Questions were answered to the patient's satisfaction.     Zareena Willis

## 2024-07-17 ENCOUNTER — Encounter (HOSPITAL_BASED_OUTPATIENT_CLINIC_OR_DEPARTMENT_OTHER): Payer: Self-pay | Admitting: Orthopedic Surgery

## 2024-07-29 DIAGNOSIS — M1812 Unilateral primary osteoarthritis of first carpometacarpal joint, left hand: Secondary | ICD-10-CM | POA: Diagnosis not present

## 2024-07-29 DIAGNOSIS — M79642 Pain in left hand: Secondary | ICD-10-CM | POA: Diagnosis not present

## 2024-10-03 ENCOUNTER — Other Ambulatory Visit

## 2024-10-03 ENCOUNTER — Encounter
